# Patient Record
Sex: Female | Born: 1935 | Race: White | Hispanic: No | Smoking: Never smoker
Health system: Southern US, Community
[De-identification: ages and names within clinical notes are randomized; demographics above are authoritative.]

## PROBLEM LIST (undated history)

## (undated) DIAGNOSIS — I10 Essential (primary) hypertension: Secondary | ICD-10-CM

## (undated) DIAGNOSIS — E039 Hypothyroidism, unspecified: Secondary | ICD-10-CM

## (undated) DIAGNOSIS — F0390 Unspecified dementia without behavioral disturbance: Secondary | ICD-10-CM

---

## 2016-10-04 ENCOUNTER — Ambulatory Visit (HOSPITAL_COMMUNITY): Admit: 2016-10-04 | Payer: Self-pay | Admitting: Internal Medicine

## 2016-10-04 ENCOUNTER — Emergency Department (HOSPITAL_COMMUNITY): Payer: Medicare Other

## 2016-10-04 ENCOUNTER — Encounter (HOSPITAL_COMMUNITY)
Admission: EM | Disposition: A | Discharge status: Skilled Nursing Facility | Payer: Self-pay | Source: Home / Self Care | Attending: Cardiovascular Disease

## 2016-10-04 ENCOUNTER — Inpatient Hospital Stay (HOSPITAL_COMMUNITY)
Admission: EM | Admit: 2016-10-04 | Discharge: 2016-10-08 | DRG: 282 | Disposition: A | Discharge status: Skilled Nursing Facility | Payer: Medicare Other | Attending: Cardiovascular Disease | Admitting: Cardiovascular Disease

## 2016-10-04 DIAGNOSIS — Z66 Do not resuscitate: Secondary | ICD-10-CM | POA: Diagnosis present

## 2016-10-04 DIAGNOSIS — I272 Pulmonary hypertension, unspecified: Secondary | ICD-10-CM | POA: Diagnosis present

## 2016-10-04 DIAGNOSIS — F0390 Unspecified dementia without behavioral disturbance: Secondary | ICD-10-CM | POA: Diagnosis present

## 2016-10-04 DIAGNOSIS — Z8249 Family history of ischemic heart disease and other diseases of the circulatory system: Secondary | ICD-10-CM | POA: Diagnosis not present

## 2016-10-04 DIAGNOSIS — F0151 Vascular dementia with behavioral disturbance: Secondary | ICD-10-CM | POA: Diagnosis not present

## 2016-10-04 DIAGNOSIS — E039 Hypothyroidism, unspecified: Secondary | ICD-10-CM | POA: Diagnosis present

## 2016-10-04 DIAGNOSIS — I213 ST elevation (STEMI) myocardial infarction of unspecified site: Secondary | ICD-10-CM

## 2016-10-04 DIAGNOSIS — E785 Hyperlipidemia, unspecified: Secondary | ICD-10-CM | POA: Diagnosis present

## 2016-10-04 DIAGNOSIS — I10 Essential (primary) hypertension: Secondary | ICD-10-CM | POA: Diagnosis present

## 2016-10-04 DIAGNOSIS — I219 Acute myocardial infarction, unspecified: Secondary | ICD-10-CM | POA: Diagnosis not present

## 2016-10-04 DIAGNOSIS — E7800 Pure hypercholesterolemia, unspecified: Secondary | ICD-10-CM | POA: Diagnosis not present

## 2016-10-04 DIAGNOSIS — I214 Non-ST elevation (NSTEMI) myocardial infarction: Principal | ICD-10-CM | POA: Diagnosis present

## 2016-10-04 DIAGNOSIS — R509 Fever, unspecified: Secondary | ICD-10-CM

## 2016-10-04 DIAGNOSIS — Z79899 Other long term (current) drug therapy: Secondary | ICD-10-CM | POA: Diagnosis not present

## 2016-10-04 DIAGNOSIS — E038 Other specified hypothyroidism: Secondary | ICD-10-CM | POA: Diagnosis not present

## 2016-10-04 HISTORY — DX: Unspecified dementia, unspecified severity, without behavioral disturbance, psychotic disturbance, mood disturbance, and anxiety: F03.90

## 2016-10-04 HISTORY — DX: Essential (primary) hypertension: I10

## 2016-10-04 HISTORY — DX: Hypothyroidism, unspecified: E03.9

## 2016-10-04 LAB — DIFFERENTIAL
Basophils Absolute: 0 10*3/uL (ref 0.0–0.1)
Basophils Relative: 0 %
Eosinophils Absolute: 0.2 10*3/uL (ref 0.0–0.7)
Eosinophils Relative: 2 %
LYMPHS PCT: 30 %
Lymphs Abs: 2.6 10*3/uL (ref 0.7–4.0)
Monocytes Absolute: 0.5 10*3/uL (ref 0.1–1.0)
Monocytes Relative: 6 %
NEUTROS ABS: 5.4 10*3/uL (ref 1.7–7.7)
NEUTROS PCT: 62 %

## 2016-10-04 LAB — I-STAT TROPONIN, ED: TROPONIN I, POC: 0.83 ng/mL — AB (ref 0.00–0.08)

## 2016-10-04 LAB — COMPREHENSIVE METABOLIC PANEL
ALBUMIN: 3.5 g/dL (ref 3.5–5.0)
ALT: 11 U/L — ABNORMAL LOW (ref 14–54)
ANION GAP: 7 (ref 5–15)
AST: 24 U/L (ref 15–41)
Alkaline Phosphatase: 85 U/L (ref 38–126)
BILIRUBIN TOTAL: 0.5 mg/dL (ref 0.3–1.2)
BUN: 25 mg/dL — ABNORMAL HIGH (ref 6–20)
CO2: 24 mmol/L (ref 22–32)
Calcium: 9.5 mg/dL (ref 8.9–10.3)
Chloride: 108 mmol/L (ref 101–111)
Creatinine, Ser: 1.27 mg/dL — ABNORMAL HIGH (ref 0.44–1.00)
GFR calc non Af Amer: 36 mL/min — ABNORMAL LOW (ref >60)
GFR, EST AFRICAN AMERICAN: 41 mL/min — AB (ref >60)
GLUCOSE: 102 mg/dL — AB (ref 65–99)
POTASSIUM: 4.4 mmol/L (ref 3.5–5.1)
SODIUM: 139 mmol/L (ref 135–145)
TOTAL PROTEIN: 6.8 g/dL (ref 6.5–8.1)

## 2016-10-04 LAB — CBC
HCT: 40.1 % (ref 36.0–46.0)
Hemoglobin: 13.2 g/dL (ref 12.0–15.0)
MCH: 28.6 pg (ref 26.0–34.0)
MCHC: 32.9 g/dL (ref 30.0–36.0)
MCV: 87 fL (ref 78.0–100.0)
PLATELETS: 173 10*3/uL (ref 150–400)
RBC: 4.61 MIL/uL (ref 3.87–5.11)
RDW: 14.4 % (ref 11.5–15.5)
WBC: 8.6 10*3/uL (ref 4.0–10.5)

## 2016-10-04 LAB — LIPID PANEL
CHOL/HDL RATIO: 4.9 ratio
Cholesterol: 294 mg/dL — ABNORMAL HIGH (ref 0–200)
HDL: 60 mg/dL (ref >40)
LDL Cholesterol: 191 mg/dL — ABNORMAL HIGH (ref 0–99)
TRIGLYCERIDES: 216 mg/dL — AB (ref <150)
VLDL: 43 mg/dL — ABNORMAL HIGH (ref 0–40)

## 2016-10-04 LAB — PROTIME-INR
INR: 0.91
PROTHROMBIN TIME: 12.3 s (ref 11.4–15.2)

## 2016-10-04 LAB — APTT: aPTT: 32 seconds (ref 24–36)

## 2016-10-04 SURGERY — LEFT HEART CATH AND CORONARY ANGIOGRAPHY
Anesthesia: LOCAL

## 2016-10-04 MED ORDER — ATORVASTATIN CALCIUM 80 MG PO TABS: 80.0000 mg | ORAL_TABLET | Freq: Every day | ORAL | Status: DC

## 2016-10-04 MED ORDER — NITROGLYCERIN 0.4 MG SL SUBL: 0.4000 mg | SUBLINGUAL_TABLET | Freq: Once | SUBLINGUAL | Status: DC

## 2016-10-04 MED ORDER — HEPARIN BOLUS VIA INFUSION
4000.0000 [IU] | Freq: Once | INTRAVENOUS | Status: AC
  Administered 2016-10-04: 4000 [IU] via INTRAVENOUS
  Filled 2016-10-04: qty 4000

## 2016-10-04 MED ORDER — HEPARIN (PORCINE) IN NACL 100-0.45 UNIT/ML-% IJ SOLN
800.0000 [IU]/h | INTRAMUSCULAR | Status: DC
Start: 2016-10-04 — End: 2016-10-08
  Administered 2016-10-04 – 2016-10-06 (×2): 850 [IU]/h via INTRAVENOUS
  Filled 2016-10-04 (×4): qty 250

## 2016-10-04 MED ORDER — NITROGLYCERIN IN D5W 200-5 MCG/ML-% IV SOLN
2.0000 ug/min | INTRAVENOUS | Status: DC
  Administered 2016-10-04: 5 ug/min via INTRAVENOUS
  Administered 2016-10-06: 30 ug/min via INTRAVENOUS
  Filled 2016-10-04 (×3): qty 250

## 2016-10-04 MED ORDER — MORPHINE SULFATE (PF) 4 MG/ML IV SOLN: 2.0000 mg | INTRAVENOUS | Status: DC | PRN

## 2016-10-04 NOTE — Significant Event (Signed)
[facility] Event Note  Patient was brought to [facility] ED as CODE STEMI.  She reportedly developed chest and left arm pain this evening, though she is unable to provide any details due to dementia.  Currently, she provides mixed symptoms, including periods of chest pressure followed by lack of any chest pain/discomfort over the course of a minute.  She is resting comfortably with markedly elevated blood pressure.  Exam is notable for distant heart sounds without murmurs.  Abdomen is soft with mild left lower quadrant tenderness.  Lungs are clear bilaterally.  There is trace pretibial edema.  The patient appears pale.  EKG demonstrates NSR with 1st degree AV block and non-specific ST changes.  Subtle ST elevation (less than 1 mm is noted in I, aVL and V2).  istat Troponin is mildly elevated at 0.83.  I have spoken with the patient's daughter and healthcare POA, Maximino Sarin.  She states that her mother's dementia is quite advanced but that she would want invasive procedures if necessary.  The patient has a DNR, but per her daughter, this would be reversed for cardiac catheterization.  We will not proceed with emergent LHC given the patient's vague symptoms and non-specific EKG changes that do not meed STEMI criteria.  We will admit her to the cardiology service with medical therapy for NSTEMI.  Yvonne Kendall, MD [facility] Pager: [phone]

## 2016-10-04 NOTE — ED Provider Notes (Signed)
LevelVcaveatdementia urgency of situation history is obtained from EMS. He complained of chest pain earlier this evening. She was treated by EMS with 2 sublingual nitroglycerin and 4 baby aspirin's. She is asymptomatic as I examine her. Denies any chest pain. EMS called code STEMI in the field. On exam no distress lungs clear auscultation heart regular rate and rhythm abdomen nondistended nontender extremities with trace pretibial edema. EKG possibly consistent with acute lateral wall STEMI with no old EKG for comparison however patient is pain-free presently. Dr. END, from cardiology service was present in the ED and will admit patient. He requests IV heparin. Ordered by me.   Doug Sou, MD 10/04/16 2211

## 2016-10-04 NOTE — Progress Notes (Signed)
ANTICOAGULATION CONSULT NOTE - Initial Consult  Pharmacy Consult for Heparin Indication: chest pain/ACS  No Known Allergies  Patient Measurements: Height: 5\' 3"  (160 cm) Weight: 167 lb 8.8 oz (76 kg) IBW/kg (Calculated) : 52.4 Heparin Dosing Weight: 68 kg  Vital Signs: Temp: 99.3 F (37.4 C) (12/30 2148) Temp Source: Oral (12/30 2148) BP: 164/112 (12/30 2148) Pulse Rate: 90 (12/30 2148)  Labs:  Recent Labs  10/04/16 2153  HGB 13.2  HCT 40.1  PLT 173  APTT 32  LABPROT 12.3  INR 0.91    CrCl cannot be calculated (No order found.).   Medical History: No past medical history on file.  Medications:  See electronic med rec  Assessment: 82 y.o. F presents with NSTEMI. To begin heparin. CBC ok on admission.  Goal of Therapy:  Heparin level 0.3-0.7 units/ml Monitor platelets by anticoagulation protocol: Yes   Plan:  Heparin IV bolus 4000 units Heparin gtt at 850 units/hr Will f/u heparin level in 8 hours Daily heparin level and CBC  Christoper Fabian, PharmD, BCPS Clinical pharmacist, pager [phone] 10/04/2016,10:41 PM

## 2016-10-04 NOTE — ED Triage Notes (Signed)
Pt from [place] c/o of chest pain this afternoon. Sharp pressure that radiates to the left. Given 324 ASA and 2x nitro

## 2016-10-04 NOTE — H&P (Signed)
    Patient ID: Bonnie [name] MRN: [id number], DOB/AGE: 80/17/1923   Admit date: 10/04/2016   Primary Physician: No primary care provider on file. Primary Cardiologist: None  Problem List Chest pain Hypertension Dementia Hypothyroidism  Allergies NKDA  HPI 82 yo female with history of hypertension, hypothyroidism and dementia who presents from her nursing home with chest and left arm pain.  This evening, 12/30 she reported chest pressure and left arm pain to her nurses and thus EMS was called. Speaking with her nurse from the nursing home, the nurse said the patient was in her normal state of health when walked up and told her about the chest pain which had started around 7-8pm.  The patient was unable to provide consistent details about her chest pain, at time present as pressure while moments later without any complaints or pain. In ED, she was comfortable with a BP of 202/84.   Initially EMS activated for STEMI, but EKG with only subtle (<17mm) elevations in I, aVL, V2). No indication for urgent cath at this time.   Home Medications Coreg 3.125mg  BID Sertraline Levothyroxine Albuterol  Family History: history of CAD in family  Social History Lives in NH, no reports of smoking use in past  Review of Systems Unable to assess reliably given dementia but denies most ROS  Physical Exam  Blood pressure (!) 164/112, pulse 90, temperature 99.3 F (37.4 C), temperature source Oral, resp. rate 18, SpO2 96 %.  General: Pleasant, NAD Neuro: Moves all extremities spontaneously. HEENT: Normal  Neck: Supple without bruits or JVD. Lungs:  Resp regular and unlabored, CTA. Heart: RRR, distant heart sounds Abdomen: Soft, non-tender, non-distended, BS + x 4.  Extremities: No clubbing, cyanosis. Trace edema. DP/PT/Radials 2+ and equal bilaterally.  Labs  Troponin [facility] Test)  Recent Labs  10/04/16 2156  TROPIPOC 0.83*    Cr 1.2  ECG NSR with 1st degree AV  block and non-specific ST changes.  Subtle ST elevation (less than 1 mm is noted in I, aVL and V2).  ================================ ASSESSMENT AND PLAN 82 yo female with history of hypertension, hypothyroidism and dementia who presents from her nursing home with chest and left arm pain, found to have an NSTEMI.  # NSTEMI: trop 0.83. Intermittent chest pressure.  - Heparin gtt - ASA 325x1, ASA 81mg  daily - Metop BID - Atorvastatin - Nitro gtt, titrated to chest pain - Morphine PRN - repeat troponinx3 and ekg - TTE to assess EF/WMA - if chest pressure/pain unrelenting with Nitroglycerin, will d/w patient's family about LHC with possible PCI  # HTN - medical management as above  # Hypothyroidism - f/u TSH  # HLD - statin   # Dementia  Code status: DNR (family ok to reverse this for cath if needed)  Signed, Yehuda Savannah, MD

## 2016-10-04 NOTE — ED Provider Notes (Signed)
[facility]-EMERGENCY DEPT Provider Note   CSN: [id number] Arrival date & time: 10/04/16  2144   History   Chief Complaint Chief Complaint  Patient presents with  . Chest Pain    HPI Bonnie [name] is a 82 y.o. female.  The history is provided by the patient and the EMS personnel.  Chest Pain   This is a new problem. The current episode started 3 to 5 hours ago (unclear onset but maybe around dinner time). The problem has been gradually improving. The pain is present in the substernal region. The pain is at a severity of 3/10. The pain is mild. The quality of the pain is described as pressure-like. The pain does not radiate. Duration of episode(s) is 3 hours. Pertinent negatives include no abdominal pain, no fever, no leg pain, no lower extremity edema, no nausea, no shortness of breath and no syncope. She has tried nitroglycerin (Pt receive ASA and 2 NTG SL PTA, CP improved after 2nd NTG) for the symptoms.  -Patient is demented and came in from memory care unit. Patient informed the staff that she was having chest pain around dinner time. Patient describes the pain as "feels like someone is standing on my chest". On arrival patient with chest pain 3/10 but became more severe well in the Ed. Patient presented as a code STEMI.   Past Medical History:  Diagnosis Date  . Dementia   . Hypertension   . Hypothyroid     Patient Active Problem List   Diagnosis Date Noted  . NSTEMI (non-ST elevated myocardial infarction) (HCC) 10/04/2016    No past surgical history on file.  OB History    No data available       Home Medications    Prior to Admission medications   Medication Sig Start Date End Date Taking? Authorizing Provider  acetaminophen (TYLENOL) 500 MG tablet Take 500 mg by mouth 2 (two) times daily.   Yes Historical Provider, MD  albuterol (PROVENTIL HFA;VENTOLIN HFA) 108 (90 Base) MCG/ACT inhaler Inhale 2 puffs into the lungs every 6 (six) hours as needed for wheezing or  shortness of breath.   Yes Historical Provider, MD  carvedilol (COREG) 6.25 MG tablet Take 3.125 mg by mouth daily.   Yes Historical Provider, MD  levothyroxine (SYNTHROID, LEVOTHROID) 25 MCG tablet Take 25 mcg by mouth See admin instructions. On Monday, Tuesday, Wednesday and Thursday   Yes Historical Provider, MD  levothyroxine (SYNTHROID, LEVOTHROID) 50 MCG tablet Take 50 mcg by mouth See admin instructions. On Friday, Saturday and Sunday   Yes Historical Provider, MD  loperamide (IMODIUM A-D) 2 MG tablet Take 2 mg by mouth every 8 (eight) hours as needed for diarrhea or loose stools.   Yes Historical Provider, MD  ranitidine (ZANTAC) 150 MG tablet Take 150 mg by mouth daily as needed for heartburn.   Yes Historical Provider, MD  sertraline (ZOLOFT) 50 MG tablet Take 50 mg by mouth daily.   Yes Historical Provider, MD    Family History No family history on file.  Social History Social History  Substance Use Topics  . Smoking status: Not on file  . Smokeless tobacco: Not on file  . Alcohol use Not on file     Allergies   Patient has no known allergies.   Review of Systems Review of Systems  Unable to perform ROS: Dementia  Constitutional: Negative for fever.  Respiratory: Negative for shortness of breath.   Cardiovascular: Positive for chest pain. Negative for leg swelling and  syncope.  Gastrointestinal: Negative for abdominal pain and nausea.  Neurological: Negative for syncope and light-headedness.  All other systems reviewed and are negative.    Physical Exam Updated Vital Signs BP 175/72   Pulse 75   Temp 99.3 F (37.4 C) (Oral)   Resp 17   Ht 5\' 3"  (1.6 m)   Wt 76 kg   SpO2 96%   BMI 29.68 kg/m   Physical Exam  Constitutional: She appears well-developed and well-nourished. No distress.  HENT:  Head: Normocephalic and atraumatic.  Eyes: Conjunctivae are normal.  Neck: Neck supple.  Cardiovascular: Normal rate, regular rhythm and intact distal pulses.     No murmur heard. Pulmonary/Chest: Effort normal and breath sounds normal. No respiratory distress. She has no wheezes. She has no rales.  Abdominal: Soft. There is no tenderness.  Musculoskeletal: She exhibits no edema (trace pitting edema).  Neurological: She is alert.  Skin: Skin is warm and dry.  Psychiatric: She has a normal mood and affect.  Nursing note and vitals reviewed.    ED Treatments / Results  Labs (all labs ordered are listed, but only abnormal results are displayed) Labs Reviewed  COMPREHENSIVE METABOLIC PANEL - Abnormal; Notable for the following:       Result Value   Glucose, Bld 102 (*)    BUN 25 (*)    Creatinine, Ser 1.27 (*)    ALT 11 (*)    GFR calc non Af Amer 36 (*)    GFR calc Af Amer 41 (*)    All other components within normal limits  LIPID PANEL - Abnormal; Notable for the following:    Cholesterol 294 (*)    Triglycerides 216 (*)    VLDL 43 (*)    LDL Cholesterol 191 (*)    All other components within normal limits  I-STAT TROPOININ, ED - Abnormal; Notable for the following:    Troponin i, poc 0.83 (*)    All other components within normal limits  CBC  DIFFERENTIAL  PROTIME-INR  APTT  HEPARIN LEVEL (UNFRACTIONATED)  I-STAT TROPOININ, ED    EKG  EKG Interpretation  Date/Time:  Saturday October 04 2016 21:48:29 EST Ventricular Rate:  89 PR Interval:    QRS Duration: 102 QT Interval:  379 QTC Calculation: 462 R Axis:   -26 Text Interpretation:  Sinus rhythm Prolonged PR interval Borderline left axis deviation Low voltage, precordial leads Abnormal lateral Q waves No old tracing to compare Confirmed by Ethelda Chick  MD, SAM [phone]) on 10/04/2016 9:54:43 PM       Radiology Dg Chest Port 1 View  Result Date: 10/04/2016 CLINICAL DATA:  Chest pain this afternoon. Chest pressure radiates the left. EXAM: PORTABLE CHEST 1 VIEW COMPARISON:  None. FINDINGS: Heart is borderline enlarged. There is aortic atherosclerosis without aneurysm.  Mild diffuse interstitial prominence is noted with peribronchial thickening which may reflect bronchitic change. There is atelectasis at each base. Lung volumes are slightly low. No suspicious osseous abnormality. IMPRESSION: Mild interstitial prominence with peribronchial thickening may reflect prior bronchitic change. Aortic atherosclerosis with borderline cardiomegaly. Electronically Signed   By: Tollie Eth M.D.   On: 10/04/2016 22:22    Procedures Procedures (including critical care time)  Medications Ordered in ED Medications  nitroGLYCERIN (NITROSTAT) SL tablet 0.4 mg (not administered)  morphine 4 MG/ML injection 2 mg (not administered)  atorvastatin (LIPITOR) tablet 80 mg (not administered)  nitroGLYCERIN 50 mg in dextrose 5 % 250 mL (0.2 mg/mL) infusion (5 mcg/min Intravenous New Bag/Given  10/04/16 2252)  heparin ADULT infusion 100 units/mL (25000 units/22350mL sodium chloride 0.45%) (850 Units/hr Intravenous New Bag/Given 10/04/16 2305)  heparin bolus via infusion 4,000 Units (4,000 Units Intravenous Bolus from Bag 10/04/16 2305)     Initial Impression / Assessment and Plan / ED Course  I have reviewed the triage vital signs and the nursing notes.  Pertinent labs & imaging results that were available during my care of the patient were reviewed by me and considered in my medical decision making (see chart for details).  Clinical Course    Patient is a 82 year old female with history hypertension, hypothyroidism, dementia who presents with 3 hours of substernal chest pain. Patient received aspirin and 2 doses of nitroglycerin prior to arrival. Second is about to glycerin reduced the patient's pain.  Unclear history given the patient's dementia however chest pain began likely around dinnertime. Patient informed the staff that she was having chest discomfort. Pain initially 3/10 on arrival to the ED but did become more severe around 10 minutes into her ED stay. Patient presented as a  code STEMI. Patient has small ST elevations in lead 1, aVL, V2. ST depression in lead 3, aVF. Cardiology evaluated the patient in the ED and deemed this to not be a STEMI but rather NSTEMI. First troponin was 0.83. Patient given heparin bolus and heparin drip. Additional dose of nitroglycerin relieved the patient's pain.  Patient will be admitted to cardiology for further management. Cardiology does not plan to take the patient to the Cath Lab at this time.  Pt seen with attending Dr. Ethelda ChickJacubowitz.  Final Clinical Impressions(s) / ED Diagnoses   Final diagnoses:  ST elevation myocardial infarction (STEMI), unspecified artery [facility](HCC)    New Prescriptions New Prescriptions   No medications on file     Dwana MelenaRobin Alizza Sacra, DO 10/05/16 0101    Doug SouSam Jacubowitz, MD 10/05/16 [phone]

## 2016-10-05 ENCOUNTER — Encounter (HOSPITAL_COMMUNITY): Payer: Self-pay | Admitting: Emergency Medicine

## 2016-10-05 DIAGNOSIS — I10 Essential (primary) hypertension: Secondary | ICD-10-CM

## 2016-10-05 DIAGNOSIS — I214 Non-ST elevation (NSTEMI) myocardial infarction: Principal | ICD-10-CM

## 2016-10-05 DIAGNOSIS — E038 Other specified hypothyroidism: Secondary | ICD-10-CM

## 2016-10-05 DIAGNOSIS — F0390 Unspecified dementia without behavioral disturbance: Secondary | ICD-10-CM

## 2016-10-05 DIAGNOSIS — E7800 Pure hypercholesterolemia, unspecified: Secondary | ICD-10-CM

## 2016-10-05 LAB — PROTIME-INR
INR: 1.03
PROTHROMBIN TIME: 13.6 s (ref 11.4–15.2)

## 2016-10-05 LAB — GLUCOSE, CAPILLARY
GLUCOSE-CAPILLARY: 109 mg/dL — AB (ref 65–99)
GLUCOSE-CAPILLARY: 122 mg/dL — AB (ref 65–99)
Glucose-Capillary: 104 mg/dL — ABNORMAL HIGH (ref 65–99)

## 2016-10-05 LAB — TSH: TSH: 2.998 u[IU]/mL (ref 0.350–4.500)

## 2016-10-05 LAB — BASIC METABOLIC PANEL
ANION GAP: 8 (ref 5–15)
BUN: 20 mg/dL (ref 6–20)
CALCIUM: 9.2 mg/dL (ref 8.9–10.3)
CO2: 26 mmol/L (ref 22–32)
CREATININE: 1.1 mg/dL — AB (ref 0.44–1.00)
Chloride: 106 mmol/L (ref 101–111)
GFR, EST AFRICAN AMERICAN: 49 mL/min — AB (ref >60)
GFR, EST NON AFRICAN AMERICAN: 42 mL/min — AB (ref >60)
Glucose, Bld: 98 mg/dL (ref 65–99)
Potassium: 4.4 mmol/L (ref 3.5–5.1)
SODIUM: 140 mmol/L (ref 135–145)

## 2016-10-05 LAB — CBC
HCT: 36.8 % (ref 36.0–46.0)
HEMOGLOBIN: 11.9 g/dL — AB (ref 12.0–15.0)
MCH: 28.7 pg (ref 26.0–34.0)
MCHC: 32.3 g/dL (ref 30.0–36.0)
MCV: 88.9 fL (ref 78.0–100.0)
Platelets: 145 10*3/uL — ABNORMAL LOW (ref 150–400)
RBC: 4.14 MIL/uL (ref 3.87–5.11)
RDW: 14.6 % (ref 11.5–15.5)
WBC: 7.3 10*3/uL (ref 4.0–10.5)

## 2016-10-05 LAB — LIPID PANEL
CHOLESTEROL: 256 mg/dL — AB (ref 0–200)
HDL: 58 mg/dL (ref >40)
LDL Cholesterol: 175 mg/dL — ABNORMAL HIGH (ref 0–99)
Total CHOL/HDL Ratio: 4.4 RATIO
Triglycerides: 115 mg/dL (ref <150)
VLDL: 23 mg/dL (ref 0–40)

## 2016-10-05 LAB — HEPARIN LEVEL (UNFRACTIONATED): HEPARIN UNFRACTIONATED: 0.55 [IU]/mL (ref 0.30–0.70)

## 2016-10-05 LAB — I-STAT TROPONIN, ED: TROPONIN I, POC: 2.1 ng/mL — AB (ref 0.00–0.08)

## 2016-10-05 LAB — MRSA PCR SCREENING: MRSA by PCR: NEGATIVE

## 2016-10-05 LAB — TROPONIN I
TROPONIN I: 4.81 ng/mL — AB (ref <0.03)
TROPONIN I: 6.26 ng/mL — AB (ref <0.03)
Troponin I: 2.69 ng/mL (ref <0.03)

## 2016-10-05 MED ORDER — ONDANSETRON HCL 4 MG/2ML IJ SOLN
4.0000 mg | Freq: Four times a day (QID) | INTRAMUSCULAR | Status: DC | PRN
  Administered 2016-10-06: 4 mg via INTRAVENOUS
  Filled 2016-10-05: qty 2

## 2016-10-05 MED ORDER — LEVOTHYROXINE SODIUM 75 MCG PO TABS
75.0000 ug | ORAL_TABLET | Freq: Every day | ORAL | Status: DC
  Administered 2016-10-05 – 2016-10-08 (×3): 75 ug via ORAL
  Filled 2016-10-05 (×3): qty 1

## 2016-10-05 MED ORDER — NITROGLYCERIN 0.4 MG SL SUBL: 0.4000 mg | SUBLINGUAL_TABLET | SUBLINGUAL | Status: DC | PRN

## 2016-10-05 MED ORDER — ATORVASTATIN CALCIUM 80 MG PO TABS
80.0000 mg | ORAL_TABLET | Freq: Every day | ORAL | Status: DC
  Administered 2016-10-07: 80 mg via ORAL
  Filled 2016-10-05 (×3): qty 1

## 2016-10-05 MED ORDER — LORAZEPAM 0.5 MG PO TABS: 0.5000 mg | ORAL_TABLET | ORAL | Status: DC

## 2016-10-05 MED ORDER — ASPIRIN EC 81 MG PO TBEC
81.0000 mg | DELAYED_RELEASE_TABLET | Freq: Every day | ORAL | Status: DC
  Administered 2016-10-05 – 2016-10-08 (×4): 81 mg via ORAL
  Filled 2016-10-05 (×4): qty 1

## 2016-10-05 MED ORDER — LORAZEPAM 2 MG/ML IJ SOLN
0.5000 mg | Freq: Once | INTRAMUSCULAR | Status: AC
  Administered 2016-10-05: 0.5 mg via INTRAVENOUS
  Filled 2016-10-05: qty 1

## 2016-10-05 MED ORDER — METOPROLOL TARTRATE 12.5 MG HALF TABLET
12.5000 mg | ORAL_TABLET | Freq: Two times a day (BID) | ORAL | Status: DC
  Administered 2016-10-05 (×3): 12.5 mg via ORAL
  Filled 2016-10-05 (×3): qty 1

## 2016-10-05 MED ORDER — ACETAMINOPHEN 325 MG PO TABS
650.0000 mg | ORAL_TABLET | ORAL | Status: DC | PRN
  Administered 2016-10-07: 650 mg via ORAL
  Filled 2016-10-05: qty 2

## 2016-10-05 NOTE — ED Notes (Signed)
Attempted to call report

## 2016-10-05 NOTE — Progress Notes (Signed)
Pts daughter not wanting pt to take lipitor, states pt has been c/o right flank pain which is similar to the pain she experienced when she took lipitor, evening dose held per request, MD notified.  Raymon Mutton RN

## 2016-10-05 NOTE — Progress Notes (Signed)
Patient Name: Bonnie [name] Date of Encounter: 10/05/2016  Primary Cardiologist: None  Hospital Problem List     Principal Problem:   NSTEMI (non-ST elevated myocardial infarction) (HCC)     Subjective   Follows commands. Alert. Not oriented. At times says she has chest pain, at others she does not.  Inpatient Medications    Scheduled Meds: . aspirin EC  81 mg Oral Daily  . atorvastatin  80 mg Oral q1800  . metoprolol tartrate  12.5 mg Oral BID   Continuous Infusions: . heparin 850 Units/hr (10/04/16 2305)  . nitroGLYCERIN 30 mcg/min (10/05/16 0202)   PRN Meds: acetaminophen, nitroGLYCERIN, ondansetron (ZOFRAN) IV   Vital Signs    Vitals:   10/05/16 0148 10/05/16 0202 10/05/16 0246 10/05/16 0400  BP: (!) 219/75 (!) 213/81 (!) 168/77 (!) 154/78  Pulse:   66 69  Resp:   16   Temp:   99.2 F (37.3 C)   TempSrc:   Oral   SpO2:   95% 93%  Weight:   158 lb (71.7 kg)   Height:        Intake/Output Summary (Last 24 hours) at 10/05/16 0827 Last data filed at 10/05/16 0700  Gross per 24 hour  Intake           102.36 ml  Output              375 ml  Net          -272.64 ml   Filed Weights   10/04/16 2233 10/05/16 0246  Weight: 167 lb 8.8 oz (76 kg) 158 lb (71.7 kg)    Physical Exam    GEN: Well nourished, well developed, in no acute distress.  HEENT: Grossly normal.  Neck: Supple, no JVD. Cardiac: RRR, no murmurs, rubs, or gallops. No clubbing, cyanosis, edema.  Respiratory:  Respirations regular and unlabored, clear to auscultation bilaterally. GI: Soft, nontender, nondistended MS: no deformity or atrophy. Skin: warm and dry, no rash. Psych: Alert, follows commands  Labs    CBC  Recent Labs  10/04/16 2153 10/05/16 0649  WBC 8.6 7.3  NEUTROABS 5.4  --   HGB 13.2 11.9*  HCT 40.1 36.8  MCV 87.0 88.9  PLT 173 145*   Basic Metabolic Panel  Recent Labs  10/04/16 2153 10/05/16 0649  NA 139 140  K 4.4 4.4  CL 108 106  CO2 24 26  GLUCOSE  102* 98  BUN 25* 20  CREATININE 1.27* 1.10*  CALCIUM 9.5 9.2   Liver Function Tests  Recent Labs  10/04/16 2153  AST 24  ALT 11*  ALKPHOS 85  BILITOT 0.5  PROT 6.8  ALBUMIN 3.5   No results for input(s): LIPASE, AMYLASE in the last 72 hours. Cardiac Enzymes  Recent Labs  10/05/16 0108 10/05/16 0649  TROPONINI 2.69* 4.81*   BNP Invalid input(s): POCBNP D-Dimer No results for input(s): DDIMER in the last 72 hours. Hemoglobin A1C No results for input(s): HGBA1C in the last 72 hours. Fasting Lipid Panel  Recent Labs  10/05/16 0530  CHOL 256*  HDL 58  LDLCALC 175*  TRIG 115  CHOLHDL 4.4   Thyroid Function Tests  Recent Labs  10/05/16 0530  TSH 2.998    Telemetry    NSR, short atrial run, isolated PVC's - Personally Reviewed  ECG     - Personally Reviewed  Radiology    Dg Chest Port 1 View  Result Date: 10/04/2016 CLINICAL DATA:  Chest pain this afternoon.  Chest pressure radiates the left. EXAM: PORTABLE CHEST 1 VIEW COMPARISON:  None. FINDINGS: Heart is borderline enlarged. There is aortic atherosclerosis without aneurysm. Mild diffuse interstitial prominence is noted with peribronchial thickening which may reflect bronchitic change. There is atelectasis at each base. Lung volumes are slightly low. No suspicious osseous abnormality. IMPRESSION: Mild interstitial prominence with peribronchial thickening may reflect prior bronchitic change. Aortic atherosclerosis with borderline cardiomegaly. Electronically Signed   By: Tollie Ethavid  Kwon M.D.   On: 10/04/2016 22:22    Cardiac Studies   Echo (ordering now)  Patient Profile     82 yr old woman with hypertension and dementia admitted with NSTEMI from nursing home. DNR but family agreeable to reverse for possible cath.  Assessment & Plan    1. NSTEMI: Hemodynamically and symptomatically stable. On IV heparin and nitro (30 mcg/min). Continue ASA, Lipitor, and metoprolol. I will order a 2-D echocardiogram  with Doppler to evaluate cardiac structure, function, and regional wall motion. Troponins 2.69-->4.81. I will tentatively arrange coronary angiography for 10/07/16. Will order cardiac diet for today.  2. Hypertension: Elevated, recently started on metoprolol. No adjustments at this time.  3. Hyperlipidemia: On Lipitor.  4. Dementia  5. Hypothyroidism: Will restart levothyroxine. TSH 2.99.  Code status: DNR (family ok to reverse this for cath if needed)    Signed, Prentice DockerSuresh Kylon Philbrook, MD  10/05/2016, 8:27 AM

## 2016-10-05 NOTE — Plan of Care (Signed)
Problem: Education: Goal: Knowledge of [facility] Education information/materials will improve Outcome: Not Progressing Pt confused  Problem: Safety: Goal: Ability to remain free from injury will improve Outcome: Progressing Pt is confused, placed in camera room, family has been requested to stay with pt, bed in low position, frequent assessments being made.  Problem: Pain Managment: Goal: General experience of comfort will improve Outcome: Progressing No complaints of discomfort from pt  Problem: Physical Regulation: Goal: Ability to maintain clinical measurements within normal limits will improve Outcome: Progressing BP WNL at present, labs for cardiac enzymes ongoing

## 2016-10-05 NOTE — Progress Notes (Signed)
ANTICOAGULATION CONSULT NOTE   Pharmacy Consult for Heparin Indication: chest pain/ACS  No Known Allergies  Patient Measurements: Height: 5\' 3"  (160 cm) Weight: 158 lb (71.7 kg) IBW/kg (Calculated) : 52.4 Heparin Dosing Weight: 68 kg  Vital Signs: Temp: 99.2 F (37.3 C) (12/31 0246) Temp Source: Oral (12/31 0246) BP: 154/78 (12/31 0400) Pulse Rate: 69 (12/31 0400)  Labs:  Recent Labs  10/04/16 2153 10/05/16 0108 10/05/16 0649  HGB 13.2  --  11.9*  HCT 40.1  --  36.8  PLT 173  --  145*  APTT 32  --   --   LABPROT 12.3  --  13.6  INR 0.91  --  1.03  HEPARINUNFRC  --   --  0.55  CREATININE 1.27*  --  1.10*  TROPONINI  --  2.69* 4.81*    Estimated Creatinine Clearance: 31 mL/min (by C-G formula based on SCr of 1.1 mg/dL (H)).   Medical History: Past Medical History:  Diagnosis Date  . Dementia   . Hypertension   . Hypothyroid     Medications:  See electronic med rec  Assessment: 82 y.o. F presents with NSTEMI, pharmacy to dose heparin.   Confirmatory heparin level was therapeutic at 0.55 units/mL. Platelet count and hemoglobin are slightly low at 145 k/uL and 11.9g/dL. No bleeding noted.   Goal of Therapy:  Heparin level 0.3-0.7 units/ml Monitor platelets by anticoagulation protocol: Yes   Plan:  Continue heparin infusion at 850 units/hr IV Daily heparin level and CBC Monitor for signs/symptoms of bleeding  Carylon Perches, PharmD Acute Care Pharmacy Resident  Pager: [phone] 10/05/2016

## 2016-10-06 ENCOUNTER — Inpatient Hospital Stay (HOSPITAL_COMMUNITY): Payer: Medicare Other

## 2016-10-06 DIAGNOSIS — I219 Acute myocardial infarction, unspecified: Secondary | ICD-10-CM

## 2016-10-06 LAB — CBC
HCT: 35.8 % — ABNORMAL LOW (ref 36.0–46.0)
Hemoglobin: 11.8 g/dL — ABNORMAL LOW (ref 12.0–15.0)
MCH: 28.4 pg (ref 26.0–34.0)
MCHC: 33 g/dL (ref 30.0–36.0)
MCV: 86.1 fL (ref 78.0–100.0)
PLATELETS: 168 10*3/uL (ref 150–400)
RBC: 4.16 MIL/uL (ref 3.87–5.11)
RDW: 14.2 % (ref 11.5–15.5)
WBC: 6.9 10*3/uL (ref 4.0–10.5)

## 2016-10-06 LAB — GLUCOSE, CAPILLARY
GLUCOSE-CAPILLARY: 115 mg/dL — AB (ref 65–99)
GLUCOSE-CAPILLARY: 114 mg/dL — AB (ref 65–99)
Glucose-Capillary: 96 mg/dL (ref 65–99)
Glucose-Capillary: 101 mg/dL — ABNORMAL HIGH (ref 65–99)

## 2016-10-06 LAB — HEPARIN LEVEL (UNFRACTIONATED): HEPARIN UNFRACTIONATED: 0.54 [IU]/mL (ref 0.30–0.70)

## 2016-10-06 LAB — ECHOCARDIOGRAM COMPLETE
HEIGHTINCHES: 63 in
Weight: 2528 oz

## 2016-10-06 MED ORDER — SODIUM CHLORIDE 0.9 % IV SOLN: 250.0000 mL | INTRAVENOUS | Status: DC | PRN

## 2016-10-06 MED ORDER — SODIUM CHLORIDE 0.9 % WEIGHT BASED INFUSION: 1.0000 mL/kg/h | INTRAVENOUS | Status: DC

## 2016-10-06 MED ORDER — SODIUM CHLORIDE 0.9% FLUSH: 3.0000 mL | INTRAVENOUS | Status: DC | PRN

## 2016-10-06 MED ORDER — SODIUM CHLORIDE 0.9 % WEIGHT BASED INFUSION: 3.0000 mL/kg/h | INTRAVENOUS | Status: DC

## 2016-10-06 MED ORDER — METOPROLOL TARTRATE 25 MG PO TABS
25.0000 mg | ORAL_TABLET | Freq: Two times a day (BID) | ORAL | Status: DC
  Administered 2016-10-06 – 2016-10-08 (×5): 25 mg via ORAL
  Filled 2016-10-06 (×5): qty 1

## 2016-10-06 MED ORDER — SODIUM CHLORIDE 0.9% FLUSH
3.0000 mL | Freq: Two times a day (BID) | INTRAVENOUS | Status: DC
  Administered 2016-10-06 – 2016-10-07 (×2): 3 mL via INTRAVENOUS

## 2016-10-06 MED ORDER — SODIUM CHLORIDE 0.9 % WEIGHT BASED INFUSION
1.0000 mL/kg/h | INTRAVENOUS | Status: DC
  Administered 2016-10-07: 1 mL/kg/h via INTRAVENOUS

## 2016-10-06 NOTE — Clinical Social Work Note (Signed)
Clinical Social Work Assessment  Patient Details  Name: Bonnie [name] MRN: [id number] Date of Birth: 05-17-1922  Date of referral:  10/06/16               Reason for consult:  Discharge Planning, Facility Placement                Permission sought to share information with:  Facility Medical sales representative, Family Supports Permission granted to share information::  Yes, Verbal Permission Granted  Name::     Tim  Agency::  [facility]  Relationship::     Contact Information:     Housing/Transportation Living arrangements for the past 2 months:  Assisted Dealer of Information:  Adult Children Patient Interpreter Needed:  None Criminal Activity/Legal Involvement Pertinent to Current Situation/Hospitalization:  No - Comment as needed Significant Relationships:  Adult Children Lives with:  Facility Resident Do you feel safe going back to the place where you live?  Yes Need for family participation in patient care:  Yes (Comment)  Care giving concerns:  The patient's family does not report any concerns at this time. They are happy with the care the patient has received at [facility].   Social Worker assessment / plan:  CSW spoke with patient's son Bonnie [name] by phone to complete assessment. Bonnie [name] confirms that the patient is from [facility] and plans for the patient to return to the facility at time of discharge. CSW explained CSW role and process of getting the patient back to the facility at time of discharge. CSW will assist with DC once medically appropriate.  Employment status:  Disabled (Comment on whether or not currently receiving Disability), Retired Database administrator PT Recommendations:  Not assessed at this time Information / Referral to community resources:  Other (Comment Required) (Information will be sent to [facility])  Patient/Family's Response to care:  The patient's son is happy with the care the patient has received. He  appreciates CSW's assistance with discharge planning.  Patient/Family's Understanding of and Emotional Response to Diagnosis, Current Treatment, and Prognosis:  The patient's son Bonnie [name] appears to have a good understanding of the reason for the patient's admission and what her post DC needs will be.   Emotional Assessment Appearance:  Appears stated age Attitude/Demeanor/Rapport:  Unable to Assess Affect (typically observed):  Unable to Assess Orientation:    Alcohol / Substance use:  Not Applicable Psych involvement (Current and /or in the community):  No (Comment)  Discharge Needs  Concerns to be addressed:  Care Coordination, Discharge Planning Concerns Readmission within the last 30 days:  No Current discharge risk:  Cognitively Impaired Barriers to Discharge:  Continued Medical Work up   Venita Lick, LCSW 10/06/2016, 3:40 PM

## 2016-10-06 NOTE — Progress Notes (Signed)
Patient Name: Bonnie [name] Date of Encounter: 10/06/2016  Primary Cardiologist: None  Hospital Problem List     Principal Problem:   NSTEMI (non-ST elevated myocardial infarction) (HCC)     Subjective   Complains of feeling sick to her stomach. Denies chest pain/SOB.  Inpatient Medications    Scheduled Meds: . aspirin EC  81 mg Oral Daily  . atorvastatin  80 mg Oral q1800  . levothyroxine  75 mcg Oral QAC breakfast  . metoprolol tartrate  25 mg Oral BID   Continuous Infusions: . heparin 850 Units/hr (10/06/16 0038)  . nitroGLYCERIN 30 mcg/min (10/06/16 0037)   PRN Meds: acetaminophen, nitroGLYCERIN, ondansetron (ZOFRAN) IV   Vital Signs    Vitals:   10/05/16 0400 10/05/16 1505 10/05/16 1506 10/05/16 2100  BP: (!) 154/78 (!) 144/76 (!) 144/76 (!) 151/76  Pulse: 69 (!) 151 69 76  Resp:  20 (!) 21 (!) 21  Temp:   99.5 F (37.5 C) 99.9 F (37.7 C)  TempSrc:   Oral Oral  SpO2: 93% 92% 95% 96%  Weight:      Height:        Intake/Output Summary (Last 24 hours) at 10/06/16 0825 Last data filed at 10/06/16 1610  Gross per 24 hour  Intake           342.83 ml  Output             1301 ml  Net          -958.17 ml   Filed Weights   10/04/16 2233 10/05/16 0246  Weight: 167 lb 8.8 oz (76 kg) 158 lb (71.7 kg)    Physical Exam    GEN: Well nourished, well developed, in no acute distress.  HEENT: Grossly normal.  Neck: Supple, no JVD. Cardiac: RRR, no murmurs, rubs, or gallops. No clubbing, cyanosis, edema.  Respiratory:  Respirations regular and unlabored, clear to auscultation bilaterally. GI: Soft, nontender, nondistended MS: no deformity or atrophy. Skin: warm and dry, no rash. Psych: Alert, follows commands  Labs    CBC  Recent Labs  10/04/16 2153 10/05/16 0649 10/06/16 0153  WBC 8.6 7.3 6.9  NEUTROABS 5.4  --   --   HGB 13.2 11.9* 11.8*  HCT 40.1 36.8 35.8*  MCV 87.0 88.9 86.1  PLT 173 145* 168   Basic Metabolic Panel  Recent Labs  10/04/16 2153 10/05/16 0649  NA 139 140  K 4.4 4.4  CL 108 106  CO2 24 26  GLUCOSE 102* 98  BUN 25* 20  CREATININE 1.27* 1.10*  CALCIUM 9.5 9.2   Liver Function Tests  Recent Labs  10/04/16 2153  AST 24  ALT 11*  ALKPHOS 85  BILITOT 0.5  PROT 6.8  ALBUMIN 3.5   No results for input(s): LIPASE, AMYLASE in the last 72 hours. Cardiac Enzymes  Recent Labs  10/05/16 0108 10/05/16 0649 10/05/16 1326  TROPONINI 2.69* 4.81* 6.26*   BNP Invalid input(s): POCBNP D-Dimer No results for input(s): DDIMER in the last 72 hours. Hemoglobin A1C No results for input(s): HGBA1C in the last 72 hours. Fasting Lipid Panel  Recent Labs  10/05/16 0530  CHOL 256*  HDL 58  LDLCALC 175*  TRIG 115  CHOLHDL 4.4   Thyroid Function Tests  Recent Labs  10/05/16 0530  TSH 2.998    Telemetry    NSR - Personally Reviewed  ECG    - Personally Reviewed  Radiology    Dg Chest [facility]  Result Date: 10/04/2016 CLINICAL DATA:  Chest pain this afternoon. Chest pressure radiates the left. EXAM: PORTABLE CHEST 1 VIEW COMPARISON:  None. FINDINGS: Heart is borderline enlarged. There is aortic atherosclerosis without aneurysm. Mild diffuse interstitial prominence is noted with peribronchial thickening which may reflect bronchitic change. There is atelectasis at each base. Lung volumes are slightly low. No suspicious osseous abnormality. IMPRESSION: Mild interstitial prominence with peribronchial thickening may reflect prior bronchitic change. Aortic atherosclerosis with borderline cardiomegaly. Electronically Signed   By: Tollie Eth M.D.   On: 10/04/2016 22:22    Cardiac Studies   Echo (pending, ordered 2016-10-13)  Patient Profile     83 yr old woman with hypertension and dementia admitted with NSTEMI from nursing home. DNR but family agreeable to reverse for possible cath.  Assessment & Plan    1. NSTEMI: Hemodynamically and symptomatically stable. On IV heparin and nitro  (30 mcg/min). Continue ASA, Lipitor, and metoprolol (increase to 25 mg bid). I ordered a 2-D echocardiogram with Doppler on 2016-10-13 to evaluate cardiac structure, function, and regional wall motion. It has yet to be performed. Troponins 2.69-->4.81-->6.26. I have tentatively arranged coronary angiography for 10/07/16.   2. Hypertension: Elevated, recently started on metoprolol. I will increase to 25 mg bid.  3. Hyperlipidemia: On Lipitor.  4. Dementia  5. Hypothyroidism: I restarted levothyroxine. TSH 2.99.  Code status: DNR (family ok to reverse this for cath if needed)  Signed, Prentice Docker, MD  10/06/2016, 8:25 AM

## 2016-10-06 NOTE — Progress Notes (Signed)
  Echocardiogram 2D Echocardiogram has been performed.  Arvil Chaco 10/06/2016, 3:07 PM

## 2016-10-06 NOTE — Progress Notes (Signed)
ANTICOAGULATION CONSULT NOTE   Pharmacy Consult for Heparin Indication: chest pain/ACS  No Known Allergies  Patient Measurements: Height: 5\' 3"  (160 cm) Weight: 158 lb (71.7 kg) IBW/kg (Calculated) : 52.4 Heparin Dosing Weight: 68 kg  Vital Signs: Temp: 98.6 F (37 C) (01/01 0847) Temp Source: Oral (01/01 0847) BP: 146/66 (01/01 0845) Pulse Rate: 76 (01/01 0847)  Labs:  Recent Labs  10/04/16 2153 10/05/16 0108 10/05/16 0649 10/05/16 1326 10/06/16 0153  HGB 13.2  --  11.9*  --  11.8*  HCT 40.1  --  36.8  --  35.8*  PLT 173  --  145*  --  168  APTT 32  --   --   --   --   LABPROT 12.3  --  13.6  --   --   INR 0.91  --  1.03  --   --   HEPARINUNFRC  --   --  0.55  --  0.54  CREATININE 1.27*  --  1.10*  --   --   TROPONINI  --  2.69* 4.81* 6.26*  --     Estimated Creatinine Clearance: 31 mL/min (by C-G formula based on SCr of 1.1 mg/dL (H)).   Assessment: 83 yo F with NSTEMI to continue on heparin gtt. Heparin level therapeutic at 0.54, CBC stable, no bleeding documented.  Goal of Therapy:  Heparin level 0.3-0.7 units/ml Monitor platelets by anticoagulation protocol: Yes   Plan:  Continue heparin gtt at 850 units/hr Daily heparin level and CBC Monitor for s/sx of bleeding   Mackie Pai, PharmD PGY1 Pharmacy Resident Pager: [phone] 10/06/2016 9:54 AM

## 2016-10-07 ENCOUNTER — Encounter (HOSPITAL_COMMUNITY)
Admission: EM | Disposition: A | Discharge status: Skilled Nursing Facility | Payer: Self-pay | Source: Home / Self Care | Attending: Cardiovascular Disease

## 2016-10-07 DIAGNOSIS — I10 Essential (primary) hypertension: Secondary | ICD-10-CM

## 2016-10-07 DIAGNOSIS — F0390 Unspecified dementia without behavioral disturbance: Secondary | ICD-10-CM

## 2016-10-07 DIAGNOSIS — R509 Fever, unspecified: Secondary | ICD-10-CM

## 2016-10-07 DIAGNOSIS — F0151 Vascular dementia with behavioral disturbance: Secondary | ICD-10-CM

## 2016-10-07 LAB — CBC
HEMATOCRIT: 38.2 % (ref 36.0–46.0)
HEMOGLOBIN: 12.2 g/dL (ref 12.0–15.0)
MCH: 28.6 pg (ref 26.0–34.0)
MCHC: 31.9 g/dL (ref 30.0–36.0)
MCV: 89.5 fL (ref 78.0–100.0)
Platelets: 127 10*3/uL — ABNORMAL LOW (ref 150–400)
RBC: 4.27 MIL/uL (ref 3.87–5.11)
RDW: 14.6 % (ref 11.5–15.5)
WBC: 5.6 10*3/uL (ref 4.0–10.5)

## 2016-10-07 LAB — GLUCOSE, CAPILLARY
GLUCOSE-CAPILLARY: 106 mg/dL — AB (ref 65–99)
GLUCOSE-CAPILLARY: 143 mg/dL — AB (ref 65–99)
GLUCOSE-CAPILLARY: 142 mg/dL — AB (ref 65–99)
GLUCOSE-CAPILLARY: 105 mg/dL — AB (ref 65–99)

## 2016-10-07 LAB — INFLUENZA PANEL BY PCR (TYPE A & B)
Influenza A By PCR: NEGATIVE
Influenza B By PCR: NEGATIVE

## 2016-10-07 LAB — HEPARIN LEVEL (UNFRACTIONATED): Heparin Unfractionated: 0.61 IU/mL (ref 0.30–0.70)

## 2016-10-07 LAB — TROPONIN I: Troponin I: 2.12 ng/mL (ref <0.03)

## 2016-10-07 SURGERY — LEFT HEART CATH AND CORONARY ANGIOGRAPHY
Anesthesia: LOCAL

## 2016-10-07 NOTE — Plan of Care (Signed)
Problem: Safety: Goal: Ability to remain free from injury will improve Outcome: Progressing Pt has a sitter in room. Fall risk bundle in place. Bed alarm on, non-skid socks on, hourly rounding performed. Pt frequently reoriented. Pt was only alert to self upon assessment. Will continue to monitor and assess and anticipate pt needs.   Problem: Skin Integrity: Goal: Risk for impaired skin integrity will decrease Outcome: Progressing No new signs of skin breakdown present. Pt is active, ambulates with assistance to bedside commode, turns herself in bed, dangles at the edge of the bed with assistance. Will continue to monitor pt and keep skin free from injury this shift.

## 2016-10-07 NOTE — Progress Notes (Signed)
ANTICOAGULATION CONSULT NOTE - Follow Up Consult  Pharmacy Consult for heparin Indication: chest pain/ACS  No Known Allergies  Patient Measurements: Height: 5\' 3"  (160 cm) Weight: 156 lb 4.9 oz (70.9 kg) IBW/kg (Calculated) : 52.4  Vital Signs: Temp: 98.4 F (36.9 C) (01/02 0810) Temp Source: Oral (01/02 0810) BP: 137/59 (01/02 0959) Pulse Rate: 67 (01/02 0959)  Labs:  Recent Labs  10/04/16 2153  10/05/16 0649 10/05/16 1326 10/06/16 0153 10/07/16 0924  HGB 13.2  --  11.9*  --  11.8* 12.2  HCT 40.1  --  36.8  --  35.8* 38.2  PLT 173  --  145*  --  168 127*  APTT 32  --   --   --   --   --   LABPROT 12.3  --  13.6  --   --   --   INR 0.91  --  1.03  --   --   --   HEPARINUNFRC  --   --  0.55  --  0.54 0.61  CREATININE 1.27*  --  1.10*  --   --   --   TROPONINI  --   < > 4.81* 6.26*  --  2.12*  < > = values in this interval not displayed.  Estimated Creatinine Clearance: 30.8 mL/min (by C-G formula based on SCr of 1.1 mg/dL (H)).   Medications:  Scheduled:  . aspirin EC  81 mg Oral Daily  . atorvastatin  80 mg Oral q1800  . levothyroxine  75 mcg Oral QAC breakfast  . metoprolol tartrate  25 mg Oral BID  . sodium chloride flush  3 mL Intravenous Q12H    Assessment: 83yo female with NSTEMI, plans for cath cancelled and proceed with medical management.  Heparin to continue x additional 24hr.  Heparin level is therapeutic, but up some & will back off on rate a bit.  Hg is stable, pltc dec but c/w previous results.  No bleeding noted.  Goal of Therapy:  Heparin level 0.3-0.7 units/ml Monitor platelets by anticoagulation protocol: Yes   Plan:  Decrease heparin to 800 units/hr F/U in AM Watch for s/s of bleeding   Marisue Humble, PharmD Clinical Pharmacist [facility]

## 2016-10-07 NOTE — Progress Notes (Signed)
Patient Name: Bonnie [name] Date of Encounter: 10/07/2016  Primary Cardiologist: [facility] Problem List     Principal Problem:   NSTEMI (non-ST elevated myocardial infarction) [facility]) Active Problems:   Essential hypertension   Dementia   Fever     Subjective   Had fever of 101.1 at 5Am. Given Tylenol. Now temp is normal. Pt has severe dementia and unable to tell how see is feeling. No nurse note regarding this. Daughter at bed side.   Inpatient Medications    Scheduled Meds: . aspirin EC  81 mg Oral Daily  . atorvastatin  80 mg Oral q1800  . levothyroxine  75 mcg Oral QAC breakfast  . metoprolol tartrate  25 mg Oral BID  . sodium chloride flush  3 mL Intravenous Q12H   Continuous Infusions: . sodium chloride    . heparin 850 Units/hr (10/06/16 0038)  . nitroGLYCERIN 30 mcg/min (10/06/16 0037)   PRN Meds: sodium chloride, acetaminophen, nitroGLYCERIN, ondansetron (ZOFRAN) IV, sodium chloride flush   Vital Signs    Vitals:   10/06/16 2007 10/06/16 2122 10/07/16 0500 10/07/16 0600  BP: (!) 158/59  (!) 154/59   Pulse: 65 89 78   Resp: 18  18   Temp: 98.1 F (36.7 C)  (!) 101.1 F (38.4 C) 98.5 F (36.9 C)  TempSrc: Oral  Oral Oral  SpO2: 92%  90%   Weight:   156 lb 4.9 oz (70.9 kg)   Height:        Intake/Output Summary (Last 24 hours) at 10/07/16 0809 Last data filed at 10/07/16 0500  Gross per 24 hour  Intake              315 ml  Output                0 ml  Net              315 ml   Filed Weights   10/04/16 2233 10/05/16 0246 10/07/16 0500  Weight: 167 lb 8.8 oz (76 kg) 158 lb (71.7 kg) 156 lb 4.9 oz (70.9 kg)    Physical Exam   GEN: elderly demented female in no acute distress.  HEENT: Grossly normal.  Neck: Supple, no JVD, carotid bruits, or masses. Cardiac: RRR, no murmurs, rubs, or gallops. No clubbing, cyanosis, edema.  Radials/DP/PT 2+ and equal bilaterally.  Respiratory:  Respirations regular and unlabored, clear to auscultation  bilaterally. GI: Soft, nontender, nondistended, BS + x 4. MS: no deformity or atrophy. Skin: warm and dry, no rash. Neuro:  Strength and sensation are intact. PsychFelicity [name] to name only  Labs    CBC  Recent Labs  10/04/16 2153 10/05/16 0649 10/06/16 0153  WBC 8.6 7.3 6.9  NEUTROABS 5.4  --   --   HGB 13.2 11.9* 11.8*  HCT 40.1 36.8 35.8*  MCV 87.0 88.9 86.1  PLT 173 145* 168   Basic Metabolic Panel  Recent Labs  10/04/16 2153 10/05/16 0649  NA 139 140  K 4.4 4.4  CL 108 106  CO2 24 26  GLUCOSE 102* 98  BUN 25* 20  CREATININE 1.27* 1.10*  CALCIUM 9.5 9.2   Liver Function Tests  Recent Labs  10/04/16 2153  AST 24  ALT 11*  ALKPHOS 85  BILITOT 0.5  PROT 6.8  ALBUMIN 3.5   No results for input(s): LIPASE, AMYLASE in the last 72 hours. Cardiac Enzymes  Recent Labs  10/05/16 0108 10/05/16 0649 10/05/16 1326  TROPONINI  2.69* 4.81* 6.26*   BNP Invalid input(s): POCBNP D-Dimer No results for input(s): DDIMER in the last 72 hours. Hemoglobin A1C No results for input(s): HGBA1C in the last 72 hours. Fasting Lipid Panel  Recent Labs  10/05/16 0530  CHOL 256*  HDL 58  LDLCALC 175*  TRIG 115  CHOLHDL 4.4   Thyroid Function Tests  Recent Labs  10/05/16 0530  TSH 2.998    Telemetry    Sinus rhythm with rate mostly in 60-80s intermittently goes into 130s  - Personally Reviewed  ECG    N/A  Radiology    No results found.  Cardiac Studies  Echo 10/07/15  LV EF: 55% -   60%  ------------------------------------------------------------------- Indications:      MI - acute 410.91.  ------------------------------------------------------------------- History:   Risk factors:  Hypertension. Dyslipidemia.  ------------------------------------------------------------------- Study Conclusions  - Left ventricle: The cavity size was normal. Systolic function was   normal. The estimated ejection fraction was in the range of 55%   to 60%.  Wall motion was normal; there were no regional wall   motion abnormalities. Features are consistent with a pseudonormal   left ventricular filling pattern, with concomitant abnormal   relaxation and increased filling pressure (grade 2 diastolic   dysfunction). Doppler parameters are consistent with high   ventricular filling pressure. - Aortic valve: Mildly to moderately calcified annulus. Trileaflet;   mildly thickened, mildly calcified leaflets. There was mild   regurgitation. - Mitral valve: Calcified annulus. There was mild regurgitation. - Left atrium: The atrium was moderately dilated. - Atrial septum: There was increased thickness of the septum,   consistent with lipomatous hypertrophy. - Pulmonary arteries: PA peak pressure: 50 mm Hg (S).  Impressions:  - The right ventricular systolic pressure was increased consistent   with moderate pulmonary hypertension.   Patient Profile     83 yo female with history of hypertension, hypothyroidism and dementia who presents from her nursing home with chest and left arm pain. Initially EMS activated for STEMI, but EKG with only subtle (<11mm) elevations in I, aVL, V2). No indication for urgent cath. DNR but family agreeable to reverse for possible cath.  Assessment & Plan    1.  NSTEMI:  On IV heparin and nitro (30 mcg/min). Continue ASA, Lipitor, and metoprolol (increase to 25 mg bid). Troponins 2.69-->4.81-->6.26 (last check 12/31).  Coronary angiography today. Echo showed normal LV function, grade 2 DD. The right ventricular systolic pressure was increased consistent with moderate pulmonary hypertension.  2. Hypertension: Stable on increased dose of metoprolol to 25 mg bid.  3. Hyperlipidemia: On Lipitor. 10/05/2016: Cholesterol 256; HDL 58; LDL Cholesterol 175; Triglycerides 115; VLDL 23  4. Dementia  5. Hypothyroidism: Restarted levothyroxine this admission. TSH 2.99. F/u with PCP.   6. Fever: Had fever of 101.1 at 5Am.  Given Tylenol. Now temp is normal. Pt has severe dementia. Unable to described how she is feeling. However, per daughter pt has intermittent dry cough. Lung clear. CXR on admission :mild interstitial prominence with peribronchial thickening may reflect prior bronchitic change". Will repeat CXR and get UA if return of elevated temperature.   Keep NPO for cath until seen by MD.   7. Sinus arrhythmia -  Intermittently rate goes into 130s. Looks like sinus tachycardia vs SVT. Continue BB.    Code status: DNR (family ok to reverse this for cath if needed)  Signed, [place], PA  10/07/2016, 8:09 AM   Personally seen and examined. Agree with above.  83 year old  living in memory care unit, dementia, DO NOT RESUSCITATE with non-ST elevation myocardial infarction, troponin 6  Non-ST elevation myocardial infarction  - Had lengthy conversation with daughter, Geraldine Contras. Given her advanced age, advanced dementia, and currently no complaints of chest discomfort, I decided to continue with aggressive medical management, noninvasive approach, i.e. no cardiac catheterization. First do no harm. Daughter understands. She is in agreement. She also had a temperature of 101 earlier this morning. Her extremities feel quite warm currently. I will check a flu PCR. When asked what is bothering her, she says everything. Her daughter states that his is been her typical response over the past few years. Thankfully, she is not complaining of any chest discomfort. On admission, she did have extremely high blood pressure at the nursing home. This has improved. We will go ahead and wean off nitroglycerin. I've explained statin use in the setting of MI, her daughter had leg pain with Lipitor, we will place her on this during this setting to optimize medical management. Low-dose beta blocker. Echocardiogram pending.  - Also clearly explained to the daughter, Geraldine Contras, but given her age of 72 and non-ST elevation myocardial infarction,  she is at high risk for morbidity/mortality over the next several weeks.  - We will continue with heparin IV over the next 24 hours  - I will check another troponin  - Placed on droplet precautions in case influenza is present.  Donato Schultz, MD

## 2016-10-08 LAB — CBC
HEMATOCRIT: 36.6 % (ref 36.0–46.0)
HEMOGLOBIN: 12 g/dL (ref 12.0–15.0)
MCH: 28.6 pg (ref 26.0–34.0)
MCHC: 32.8 g/dL (ref 30.0–36.0)
MCV: 87.1 fL (ref 78.0–100.0)
Platelets: 151 10*3/uL (ref 150–400)
RBC: 4.2 MIL/uL (ref 3.87–5.11)
RDW: 14.2 % (ref 11.5–15.5)
WBC: 5.8 10*3/uL (ref 4.0–10.5)

## 2016-10-08 LAB — GLUCOSE, CAPILLARY
GLUCOSE-CAPILLARY: 104 mg/dL — AB (ref 65–99)
Glucose-Capillary: 99 mg/dL (ref 65–99)

## 2016-10-08 LAB — HEPARIN LEVEL (UNFRACTIONATED): HEPARIN UNFRACTIONATED: 0.34 [IU]/mL (ref 0.30–0.70)

## 2016-10-08 MED ORDER — LEVOTHYROXINE SODIUM 75 MCG PO TABS: 75.0000 ug | ORAL_TABLET | Freq: Every day | ORAL | 1 refills | Status: AC

## 2016-10-08 MED ORDER — NITROGLYCERIN 0.4 MG SL SUBL: 0.4000 mg | SUBLINGUAL_TABLET | SUBLINGUAL | 12 refills | Status: AC | PRN

## 2016-10-08 MED ORDER — ATORVASTATIN CALCIUM 40 MG PO TABS: 40.0000 mg | ORAL_TABLET | Freq: Every day | ORAL | Status: DC

## 2016-10-08 MED ORDER — ATORVASTATIN CALCIUM 40 MG PO TABS: 40.0000 mg | ORAL_TABLET | Freq: Every day | ORAL | 6 refills | Status: AC

## 2016-10-08 MED ORDER — ASPIRIN 81 MG PO TBEC: 81.0000 mg | DELAYED_RELEASE_TABLET | Freq: Every day | ORAL | 6 refills | Status: AC

## 2016-10-08 MED ORDER — CARVEDILOL 3.125 MG PO TABS
3.1250 mg | ORAL_TABLET | Freq: Every day | ORAL | Status: DC
  Administered 2016-10-08: 3.125 mg via ORAL
  Filled 2016-10-08: qty 1

## 2016-10-08 NOTE — Discharge Summary (Signed)
Discharge Summary    Patient ID: Bonnie [name],  MRN: [id number], DOB/AGE: 1922/09/10 83 y.o.  Admit date: 10/04/2016 Discharge date: 10/08/2016  Primary Care Provider: No PCP Per Patient Primary Cardiologist: New to Dr. Purvis Sheffield  Discharge Diagnoses    Principal Problem:   NSTEMI (non-ST elevated myocardial infarction) [facility]) Active Problems:   Essential hypertension   Dementia   Fever   Allergies No Known Allergies  Diagnostic Studies/Procedures    Echo 10/06/16 Study Conclusions  - Left ventricle: The cavity size was normal. Systolic function was   normal. The estimated ejection fraction was in the range of 55%   to 60%. Wall motion was normal; there were no regional wall   motion abnormalities. Features are consistent with a pseudonormal   left ventricular filling pattern, with concomitant abnormal   relaxation and increased filling pressure (grade 2 diastolic   dysfunction). Doppler parameters are consistent with high   ventricular filling pressure. - Aortic valve: Mildly to moderately calcified annulus. Trileaflet;   mildly thickened, mildly calcified leaflets. There was mild   regurgitation. - Mitral valve: Calcified annulus. There was mild regurgitation. - Left atrium: The atrium was moderately dilated. - Atrial septum: There was increased thickness of the septum,   consistent with lipomatous hypertrophy. - Pulmonary arteries: PA peak pressure: 50 mm Hg (S).  Impressions:  - The right ventricular systolic pressure was increased consistent   with moderate pulmonary hypertension.   History of Present Illness     83 yo female with history of hypertension, hypothyroidism and dementia who presented 10/04/16 from her nursing home with chest and left arm pain.  She reported chest pressure and left arm pain to her nurses and thus EMS was called. Initially EMS activated for STEMI, but EKG with only subtle (<33mm) elevations in I, aVL, V2). The patient was  unable to provide consistent details about her chest pain, at time present as pressure while moments later without any complaints or pain. In ED, she was comfortable with a BP of 202/84. Troponin is mildly elevated at 0.83.  Dr. Okey Dupre spoken with patient's daughter and healthcare POA, Maximino Sarin.  She states that her mother's dementia is quite advanced but that she would want invasive procedures if necessary. Cancelled emergent LHC given the patient's vague symptoms and non-specific EKG changes that do not meed STEMI criteria. DNR but family agreeable to reverse for possible cath.  Hospital Course     Consultants: none.   The patient was admitted and started on IV heparin and IV nitro. Cath tentatively arrange coronary angiography for 10/07/16. However fever spiked to 101.1 around 5am at day of cath leading to cancellation. Given Tylenol. No further high temperature. Flu PCR was normal. Troponins 2.69-->4.81-->6.26-->2.12 (then discontinued heparin and IV nitro at night of 10/08/15). No further chest pain. Echo showed normal LV function, grade 2 DD. The right ventricular systolic pressure was increased consistent with moderate pulmonary hypertension.   Dr. Anne Fu had lengthy conversation with daughter, Geraldine Contras. Given her advanced age, advanced dementia, and currently no complaints of chest discomfort, decided to continue with aggressive medical management, noninvasive approach, i.e. no cardiac catheterization. First do no harm. Daughter understands. She is in agreement.   She was started on coreg 3.150mb BID (home dose) and statin to optimize medical therapy. BP mildly elevated at times. up titration as outpatient.   Due to elevated TSH 2.99 . Does adjusted of  Levothyroxine. F/u with PCP.   The patient has been seen by  Dr.  Anne Fu today and deemed ready for discharge home. All follow-up appointments have been scheduled. Discharge medications are listed below.   DC back to memory care unit. May follow up  with PCP/ memory care MD. We are available if needed.   Discharge Vitals Blood pressure (!) 150/53, pulse (!) 55, temperature 98.3 F (36.8 C), temperature source Oral, resp. rate 19, height 5\' 3"  (1.6 m), weight 156 lb 4.8 oz (70.9 kg), SpO2 99 %.  Filed Weights   10/05/16 0246 10/07/16 0500 10/08/16 0434  Weight: 158 lb (71.7 kg) 156 lb 4.9 oz (70.9 kg) 156 lb 4.8 oz (70.9 kg)    Labs & Radiologic Studies     CBC  Recent Labs  10/07/16 0924 10/08/16 0614  WBC 5.6 5.8  HGB 12.2 12.0  HCT 38.2 36.6  MCV 89.5 87.1  PLT 127* 151   Basic Metabolic Panel No results for input(s): NA, K, CL, CO2, GLUCOSE, BUN, CREATININE, CALCIUM, MG, PHOS in the last 72 hours. Liver Function Tests No results for input(s): AST, ALT, ALKPHOS, BILITOT, PROT, ALBUMIN in the last 72 hours. No results for input(s): LIPASE, AMYLASE in the last 72 hours. Cardiac Enzymes  Recent Labs  10/05/16 1326 10/07/16 0924  TROPONINI 6.26* 2.12*   BNP Invalid input(s): POCBNP D-Dimer No results for input(s): DDIMER in the last 72 hours. Hemoglobin A1C No results for input(s): HGBA1C in the last 72 hours. Fasting Lipid Panel No results for input(s): CHOL, HDL, LDLCALC, TRIG, CHOLHDL, LDLDIRECT in the last 72 hours. Thyroid Function Tests No results for input(s): TSH, T4TOTAL, T3FREE, THYROIDAB in the last 72 hours.  Invalid input(s): FREET3  Dg Chest Port 1 View  Result Date: 10/04/2016 CLINICAL DATA:  Chest pain this afternoon. Chest pressure radiates the left. EXAM: PORTABLE CHEST 1 VIEW COMPARISON:  None. FINDINGS: Heart is borderline enlarged. There is aortic atherosclerosis without aneurysm. Mild diffuse interstitial prominence is noted with peribronchial thickening which may reflect bronchitic change. There is atelectasis at each base. Lung volumes are slightly low. No suspicious osseous abnormality. IMPRESSION: Mild interstitial prominence with peribronchial thickening may reflect prior bronchitic  change. Aortic atherosclerosis with borderline cardiomegaly. Electronically Signed   By: Tollie Eth M.D.   On: 10/04/2016 22:22    Disposition   Pt is being discharged home today in good condition.  Follow-up Plans & Appointments    Follow-up Information    PCP/ memory care MD. Schedule an appointment as soon as possible for a visit in 3 day(s).   Why:  for post hospital          Discharge Instructions    Diet - low sodium heart healthy    Complete by:  As directed    Discharge instructions    Complete by:  As directed    Follow up with PCP/ memory care MD. Cardiology are available if needed.   Increase activity slowly    Complete by:  As directed       Discharge Medications   Current Discharge Medication List    START taking these medications   Details  aspirin EC 81 MG EC tablet Take 1 tablet (81 mg total) by mouth daily. Qty: 30 tablet, Refills: 6    atorvastatin (LIPITOR) 40 MG tablet Take 1 tablet (40 mg total) by mouth daily at 6 PM. Qty: 30 tablet, Refills: 6    nitroGLYCERIN (NITROSTAT) 0.4 MG SL tablet Place 1 tablet (0.4 mg total) under the tongue every 5 (five) minutes x 3  doses as needed for chest pain. Qty: 30 tablet, Refills: 12      CONTINUE these medications which have CHANGED   Details  levothyroxine (SYNTHROID, LEVOTHROID) 75 MCG tablet Take 1 tablet (75 mcg total) by mouth daily. Needs PCP visit for further refills Qty: 30 tablet, Refills: 1      CONTINUE these medications which have NOT CHANGED   Details  acetaminophen (TYLENOL) 500 MG tablet Take 500 mg by mouth 2 (two) times daily.    albuterol (PROVENTIL HFA;VENTOLIN HFA) 108 (90 Base) MCG/ACT inhaler Inhale 2 puffs into the lungs every 6 (six) hours as needed for wheezing or shortness of breath.    carvedilol (COREG) 6.25 MG tablet Take 3.125 mg by mouth daily.    loperamide (IMODIUM A-D) 2 MG tablet Take 2 mg by mouth every 8 (eight) hours as needed for diarrhea or loose stools.      ranitidine (ZANTAC) 150 MG tablet Take 150 mg by mouth daily as needed for heartburn.    sertraline (ZOLOFT) 50 MG tablet Take 50 mg by mouth daily.           Outstanding Labs/Studies   TSH in 4 weeks Consider OP f/u labs 6-8 weeks given statin initiation this admission.  Duration of Discharge Encounter   Greater than 30 minutes including physician time.  Signed, Bhagat,Bhavinkumar PA-C 10/08/2016, 1:04 PM  Personally seen and examined. Agree with above.  Hospital Problem List     Principal Problem:   NSTEMI (non-ST elevated myocardial infarction) [facility]) Active Problems:   Essential hypertension   Dementia   Fever     Subjective   Per daughter, no change. She states everything bothers her. This has been response for past few years daughter states.    Inpatient Medications    Scheduled Meds: . aspirin EC  81 mg Oral Daily  . atorvastatin  80 mg Oral q1800  . carvedilol  3.125 mg Oral Daily  . levothyroxine  75 mcg Oral QAC breakfast  . sodium chloride flush  3 mL Intravenous Q12H   Continuous Infusions:  PRN Meds: sodium chloride, acetaminophen, nitroGLYCERIN, ondansetron (ZOFRAN) IV, sodium chloride flush   Vital Signs          Vitals:   10/08/16 0422 10/08/16 0434 10/08/16 0746 10/08/16 0857  BP: (!) 176/54 (!) 163/54  (!) 171/61  Pulse: 65  69 66  Resp: 17 19    Temp: 98.5 F (36.9 C)  98.1 F (36.7 C)   TempSrc: Oral  Axillary   SpO2: 91%  99%   Weight:  156 lb 4.8 oz (70.9 kg)    Height:        Intake/Output Summary (Last 24 hours) at 10/08/16 1104 Last data filed at 10/08/16 0839  Gross per 24 hour  Intake          1199.31 ml  Output              450 ml  Net           749.31 ml        Filed Weights   10/05/16 0246 10/07/16 0500 10/08/16 0434  Weight: 158 lb (71.7 kg) 156 lb 4.9 oz (70.9 kg) 156 lb 4.8 oz (70.9 kg)    Physical Exam   GEN: elderly demented female in no acute distress.  HEENT:  Grossly normal.  Neck: Supple, no JVD, carotid bruits, or masses. Cardiac: RRR, no murmurs, rubs, or gallops. No clubbing, cyanosis, edema.  Radials/DP/PT 2+ and  equal bilaterally.  Respiratory:  Respirations regular and unlabored, clear to auscultation bilaterally. GI: Soft, nontender, nondistended, BS + x 4. MS: no deformity or atrophy. Skin: warm and dry, no rash. Neuro:  Strength and sensation are intact. Psych: AAO to name only  Labs    CBC  Recent Labs (last 2 labs)    Recent Labs  10/07/16 0924 10/08/16 0614  WBC 5.6 5.8  HGB 12.2 12.0  HCT 38.2 36.6  MCV 89.5 87.1  PLT 127* 151     Cardiac Enzymes  Recent Labs (last 2 labs)    Recent Labs  10/05/16 1326 10/07/16 0924  TROPONINI 6.26* 2.12*       Telemetry    Sinus rhythm with rate mostly in 60-80s intermittently goes into 130s brief PAT  - Personally Reviewed  ECG    N/A  Radiology    Imaging Results (Last 48 hours)  No results found.    Cardiac Studies   Echo 10/07/15  LV EF: 55% - 60%  - Left ventricle: The cavity size was normal. Systolic function wasnormal. The estimated ejection fraction was in the range of 55%to 60%. Wall motion was normal; there were no regional wallmotion abnormalities. Features are consistent with a pseudonormalleft ventricular filling pattern, with concomitant abnormalrelaxation and increased filling pressure (grade 2 diastolicdysfunction). Doppler parameters are consistent with highventricular filling pressure. - Aortic valve: Mildly to moderately calcified annulus. Trileaflet; mildly thickened, mildly calcified leaflets. There was mild regurgitation. - Mitral valve: Calcified annulus. There was mild regurgitation. - Left atrium: The atrium was moderately dilated. - Atrial septum: There was increased thickness of the septum, consistent with lipomatous hypertrophy. - Pulmonary arteries: PA peak pressure: 50 mm Hg  (S).  Impressions:  - The right ventricular systolic pressure was increased consistent with moderate pulmonary hypertension.   Patient Profile     Assessment & Plan     83 year old living in memory care unit, dementia, DO NOT RESUSCITATE with non-ST elevation myocardial infarction, troponin 6 down 2  Non-ST elevation myocardial infarction  - Had lengthy conversation with daughter, Geraldine Contras. Given her advanced age, advanced dementia, and currently no complaints of chest discomfort, I decided to continue with aggressive medical management, noninvasive approach, i.e. no cardiac catheterization. First do no harm. Daughter understands. She is in agreement. No further temp.  When asked what is bothering her, she says everything. Her daughter states that his is been her typical response over the past few years. Thankfully, she is not complaining of any chest discomfort. On admission, she did have extremely high blood pressure at the nursing home. This has improved. Off nitroglycerin. I've explained statin use in the setting of MI, her daughter had leg pain with Lipitor, we will place her on this during this setting to optimize medical management. Low-dose beta blocker as she was taking at home, coreg 3.125 BID. BP mildly elevated at times. Echocardiogram normal EF.   - Also clearly explained to the daughter, Geraldine Contras, but given her age of 35 and non-ST elevation myocardial infarction, she is at high risk for morbidity/mortality over the next several weeks.  - Flu PCR neg  - Tried to call daughter but did not answer.   OK with DC back to memory care unit. May follow up with PCP/ memory care MD. We are available if needed.   Donato Schultz, MD

## 2016-10-08 NOTE — Care Management Important Message (Signed)
Important Message  Patient Details  Name: Bonnie [name] MRN: [id number] Date of Birth: 09-04-1922   Medicare Important Message Given:  Yes    Kyla Balzarine 10/08/2016, 11:49 AM

## 2016-10-08 NOTE — Progress Notes (Signed)
CSW informed pt ready for DC- faxed DC and signed fl2 to ALF and they are able to pick patient up today.  Patient will discharge to [facility] Anticipated discharge date: 1/3 Family notified: pt dtr notified by facility Transportation by facility- request patient be at main entrance at 3pm  CSW signing off.  Burna Sis, LCSW Clinical Social Worker [phone]

## 2016-10-08 NOTE — Progress Notes (Signed)
Patient Name: Bonnie [name] Date of Encounter: 10/08/2016  Primary Cardiologist: [facility] Problem List     Principal Problem:   NSTEMI (non-ST elevated myocardial infarction) [facility]) Active Problems:   Essential hypertension   Dementia   Fever     Subjective   Per daughter, no change. She states everything bothers her. This has been response for past few years daughter states.    Inpatient Medications    Scheduled Meds: . aspirin EC  81 mg Oral Daily  . atorvastatin  80 mg Oral q1800  . carvedilol  3.125 mg Oral Daily  . levothyroxine  75 mcg Oral QAC breakfast  . sodium chloride flush  3 mL Intravenous Q12H   Continuous Infusions:  PRN Meds: sodium chloride, acetaminophen, nitroGLYCERIN, ondansetron (ZOFRAN) IV, sodium chloride flush   Vital Signs    Vitals:   10/08/16 0422 10/08/16 0434 10/08/16 0746 10/08/16 0857  BP: (!) 176/54 (!) 163/54  (!) 171/61  Pulse: 65  69 66  Resp: 17 19    Temp: 98.5 F (36.9 C)  98.1 F (36.7 C)   TempSrc: Oral  Axillary   SpO2: 91%  99%   Weight:  156 lb 4.8 oz (70.9 kg)    Height:        Intake/Output Summary (Last 24 hours) at 10/08/16 1104 Last data filed at 10/08/16 0839  Gross per 24 hour  Intake          1199.31 ml  Output              450 ml  Net           749.31 ml   Filed Weights   10/05/16 0246 10/07/16 0500 10/08/16 0434  Weight: 158 lb (71.7 kg) 156 lb 4.9 oz (70.9 kg) 156 lb 4.8 oz (70.9 kg)    Physical Exam   GEN: elderly demented female in no acute distress.  HEENT: Grossly normal.  Neck: Supple, no JVD, carotid bruits, or masses. Cardiac: RRR, no murmurs, rubs, or gallops. No clubbing, cyanosis, edema.  Radials/DP/PT 2+ and equal bilaterally.  Respiratory:  Respirations regular and unlabored, clear to auscultation bilaterally. GI: Soft, nontender, nondistended, BS + x 4. MS: no deformity or atrophy. Skin: warm and dry, no rash. Neuro:  Strength and sensation are intact. PsychFelicity Coyer to name  only  Labs    CBC  Recent Labs  10/07/16 0924 10/08/16 0614  WBC 5.6 5.8  HGB 12.2 12.0  HCT 38.2 36.6  MCV 89.5 87.1  PLT 127* 151   Cardiac Enzymes  Recent Labs  10/05/16 1326 10/07/16 0924  TROPONINI 6.26* 2.12*     Telemetry    Sinus rhythm with rate mostly in 60-80s intermittently goes into 130s brief PAT  - Personally Reviewed  ECG    N/A  Radiology    No results found.  Cardiac Studies   Echo 10/07/15  LV EF: 55% -   60%  - Left ventricle: The cavity size was normal. Systolic function was  normal. The estimated ejection fraction was in the range of 55%  to 60%. Wall motion was normal; there were no regional wall  motion abnormalities. Features are consistent with a pseudonormal  left ventricular filling pattern, with concomitant abnormal  relaxation and increased filling pressure (grade 2 diastolic  dysfunction). Doppler parameters are consistent with high  ventricular filling pressure. - Aortic valve: Mildly to moderately calcified annulus. Trileaflet;   mildly thickened, mildly calcified leaflets. There was mild  regurgitation. - Mitral valve: Calcified annulus. There was mild regurgitation. - Left atrium: The atrium was moderately dilated. - Atrial septum: There was increased thickness of the septum,   consistent with lipomatous hypertrophy. - Pulmonary arteries: PA peak pressure: 50 mm Hg (S).  Impressions:  - The right ventricular systolic pressure was increased consistent   with moderate pulmonary hypertension.   Patient Profile     Assessment & Plan     83 year old living in memory care unit, dementia, DO NOT RESUSCITATE with non-ST elevation myocardial infarction, troponin 6 down 2  Non-ST elevation myocardial infarction  - Had lengthy conversation with daughter, Geraldine Contras. Given her advanced age, advanced dementia, and currently no complaints of chest discomfort, I decided to continue with aggressive medical management, noninvasive  approach, i.e. no cardiac catheterization. First do no harm. Daughter understands. She is in agreement. No further temp.  When asked what is bothering her, she says everything. Her daughter states that his is been her typical response over the past few years. Thankfully, she is not complaining of any chest discomfort. On admission, she did have extremely high blood pressure at the nursing home. This has improved. Off nitroglycerin. I've explained statin use in the setting of MI, her daughter had leg pain with Lipitor, we will place her on this during this setting to optimize medical management. Low-dose beta blocker as she was taking at home, coreg 3.125 BID. BP mildly elevated at times. Echocardiogram normal EF.   - Also clearly explained to the daughter, Geraldine Contras, but given her age of 9 and non-ST elevation myocardial infarction, she is at high risk for morbidity/mortality over the next several weeks.  - Flu PCR neg  - Tried to call daughter but did not answer.   OK with DC back to memory care unit. May follow up with PCP/ memory care MD. We are available if needed.   Donato Schultz, MD

## 2016-10-08 NOTE — NC FL2 (Signed)
[place] MEDICAID FL2 LEVEL OF CARE SCREENING TOOL     IDENTIFICATION  Patient Name: Bonnie [name] Birthdate: [birth year] Sex: female Admission Date (Current Location): 10/04/2016  [facility] and [place] Number:  Producer, television/film/video and Address:  [facility], [address] [place], [place] [address]      Provider Number: [id number]  Attending Physician Name and Address:  Laqueta Linden, MD  Relative Name and Phone Number:       Current Level of Care: Hospital Recommended Level of Care: Assisted Living Facility, Memory Care Prior Approval Number:    Date Approved/Denied:   PASRR Number:    Discharge Plan: Other (Comment) (Memory Care- ALF)    Current Diagnoses: Patient Active Problem List   Diagnosis Date Noted  . Essential hypertension 10/07/2016  . Dementia 10/07/2016  . Fever 10/07/2016  . NSTEMI (non-ST elevated myocardial infarction) (HCC) 10/04/2016    Orientation RESPIRATION BLADDER Height & Weight     Self  Normal Continent Weight: 156 lb 4.8 oz (70.9 kg) Height:  5\' 3"  (160 cm)  BEHAVIORAL SYMPTOMS/MOOD NEUROLOGICAL BOWEL NUTRITION STATUS      Continent Diet (dime sized pieces of food)  AMBULATORY STATUS COMMUNICATION OF NEEDS Skin   Limited Assist Verbally Normal                       Personal Care Assistance Level of Assistance  Bathing, Dressing Bathing Assistance: Limited assistance   Dressing Assistance: Limited assistance     Functional Limitations Info             SPECIAL CARE FACTORS FREQUENCY                       Contractures      Additional Factors Info  Code Status, Allergies Code Status Info: DNR Allergies Info: NKA           Current Medications (10/08/2016):  This is the current hospital active medication list Current Facility-Administered Medications  Medication Dose Route Frequency Provider Last Rate Last Dose  . 0.9 %  sodium chloride infusion  250 mL Intravenous PRN  Ok Anis, NP      . acetaminophen (TYLENOL) tablet 650 mg  650 mg Oral Q4H PRN Wilma Flavin, MD   650 mg at 10/07/16 0544  . aspirin EC tablet 81 mg  81 mg Oral Daily Wilma Flavin, MD   81 mg at 10/08/16 0858  . atorvastatin (LIPITOR) tablet 40 mg  40 mg Oral q1800 Jake Bathe, MD      . carvedilol (COREG) tablet 3.125 mg  3.125 mg Oral Daily Jake Bathe, MD   3.125 mg at 10/08/16 1216  . levothyroxine (SYNTHROID, LEVOTHROID) tablet 75 mcg  75 mcg Oral QAC breakfast Laqueta Linden, MD   75 mcg at 10/08/16 0858  . nitroGLYCERIN (NITROSTAT) SL tablet 0.4 mg  0.4 mg Sublingual Q5 [name] x 3 PRN Wilma Flavin, MD      . ondansetron [facility]) injection 4 mg  4 mg Intravenous Q6H PRN Wilma Flavin, MD   4 mg at 10/06/16 0852  . sodium chloride flush (NS) 0.9 % injection 3 mL  3 mL Intravenous Q12H Ok Anis, NP   3 mL at 10/07/16 2237  . sodium chloride flush (NS) 0.9 % injection 3 mL  3 mL Intravenous PRN Ok Anis, NP  Discharge Medications: Please see discharge summary for a list of discharge medications.  Relevant Imaging Results:  Relevant Lab Results:   Additional Information    Burna Sis, LCSW

## 2017-03-03 ENCOUNTER — Emergency Department (HOSPITAL_COMMUNITY): Payer: Medicare Other

## 2017-03-03 ENCOUNTER — Emergency Department (HOSPITAL_COMMUNITY)
Admission: EM | Admit: 2017-03-03 | Discharge: 2017-03-03 | Disposition: A | Discharge status: Home / Self Care | Payer: Medicare Other | Attending: Emergency Medicine | Admitting: Emergency Medicine

## 2017-03-03 ENCOUNTER — Encounter (HOSPITAL_COMMUNITY): Payer: Self-pay

## 2017-03-03 DIAGNOSIS — I10 Essential (primary) hypertension: Secondary | ICD-10-CM | POA: Diagnosis not present

## 2017-03-03 DIAGNOSIS — E039 Hypothyroidism, unspecified: Secondary | ICD-10-CM | POA: Insufficient documentation

## 2017-03-03 DIAGNOSIS — Z79899 Other long term (current) drug therapy: Secondary | ICD-10-CM | POA: Insufficient documentation

## 2017-03-03 DIAGNOSIS — R072 Precordial pain: Secondary | ICD-10-CM | POA: Insufficient documentation

## 2017-03-03 DIAGNOSIS — I252 Old myocardial infarction: Secondary | ICD-10-CM | POA: Diagnosis not present

## 2017-03-03 DIAGNOSIS — R079 Chest pain, unspecified: Secondary | ICD-10-CM

## 2017-03-03 DIAGNOSIS — Z7982 Long term (current) use of aspirin: Secondary | ICD-10-CM | POA: Insufficient documentation

## 2017-03-03 LAB — BASIC METABOLIC PANEL
Anion gap: 9 (ref 5–15)
BUN: 20 mg/dL (ref 6–20)
CHLORIDE: 108 mmol/L (ref 101–111)
CO2: 22 mmol/L (ref 22–32)
Calcium: 9.4 mg/dL (ref 8.9–10.3)
Creatinine, Ser: 1.23 mg/dL — ABNORMAL HIGH (ref 0.44–1.00)
GFR, EST AFRICAN AMERICAN: 43 mL/min — AB (ref >60)
GFR, EST NON AFRICAN AMERICAN: 37 mL/min — AB (ref >60)
Glucose, Bld: 103 mg/dL — ABNORMAL HIGH (ref 65–99)
POTASSIUM: 4.4 mmol/L (ref 3.5–5.1)
SODIUM: 139 mmol/L (ref 135–145)

## 2017-03-03 LAB — I-STAT TROPONIN, ED
TROPONIN I, POC: 0.01 ng/mL (ref 0.00–0.08)
Troponin i, poc: 0 ng/mL (ref 0.00–0.08)

## 2017-03-03 LAB — CBC
HEMATOCRIT: 39.4 % (ref 36.0–46.0)
Hemoglobin: 12.7 g/dL (ref 12.0–15.0)
MCH: 28.7 pg (ref 26.0–34.0)
MCHC: 32.2 g/dL (ref 30.0–36.0)
MCV: 88.9 fL (ref 78.0–100.0)
Platelets: 168 10*3/uL (ref 150–400)
RBC: 4.43 MIL/uL (ref 3.87–5.11)
RDW: 14.3 % (ref 11.5–15.5)
WBC: 4.8 10*3/uL (ref 4.0–10.5)

## 2017-03-03 MED ORDER — ONDANSETRON HCL 4 MG/2ML IJ SOLN
4.0000 mg | Freq: Once | INTRAMUSCULAR | Status: AC
  Administered 2017-03-03: 4 mg via INTRAVENOUS
  Filled 2017-03-03: qty 2

## 2017-03-03 NOTE — Discharge Instructions (Signed)
Return to the ER if symptoms significantly worsen or change.  Continue medications as previously prescribed.

## 2017-03-03 NOTE — ED Triage Notes (Signed)
Pt from [facility] [place] assisted [facility]. Pt has chest pain this morning at 0830 was given 1 nitro and 324mg  ASA pt no just complaining of nausea.

## 2017-03-03 NOTE — ED Notes (Signed)
Called report to [facility]

## 2017-03-03 NOTE — ED Provider Notes (Signed)
[facility]-EMERGENCY DEPT Provider Note   CSN: [id number] Arrival date & time: 03/03/17  1029     History   Chief Complaint Chief Complaint  Patient presents with  . Chest Pain    HPI Rafael Quesada is a 83 y.o. female.  Patient is a 83 year old female with past mental history of coronary artery disease, hypertension, and dementia. She presents today for evaluation of chest pain. She was sent from her extended care facility for evaluation of this. She states that she began to have pressure in her chest while she was getting herself dressed this morning. She was given 1 nitroglycerin, then sent here for further workup. Her pain has since resolved and now her only complaint is feeling nauseated.   The history is provided by the patient.  Chest Pain   This is a recurrent problem. The current episode started 1 to 2 hours ago. The problem occurs constantly. The problem has been resolved. The pain is present in the substernal region. The pain is moderate. The quality of the pain is described as pressure-like. The pain does not radiate. Associated symptoms include nausea. Pertinent negatives include no diaphoresis and no shortness of breath. She has tried nitroglycerin for the symptoms. The treatment provided significant relief.    Past Medical History:  Diagnosis Date  . Dementia   . Hypertension   . Hypothyroid     Patient Active Problem List   Diagnosis Date Noted  . Essential hypertension 10/07/2016  . Dementia 10/07/2016  . Fever 10/07/2016  . NSTEMI (non-ST elevated myocardial infarction) (HCC) 10/04/2016    History reviewed. No pertinent surgical history.  OB History    No data available       Home Medications    Prior to Admission medications   Medication Sig Start Date End Date Taking? Authorizing Provider  acetaminophen (TYLENOL) 500 MG tablet Take 500 mg by mouth 2 (two) times daily.    [provider]  albuterol (PROVENTIL HFA;VENTOLIN HFA) 108 (90 Base)  MCG/ACT inhaler Inhale 2 puffs into the lungs every 6 (six) hours as needed for wheezing or shortness of breath.    [provider]  aspirin EC 81 MG EC tablet Take 1 tablet (81 mg total) by mouth daily. 10/09/16   Bhagat, Sharrell Ku, PA  atorvastatin (LIPITOR) 40 MG tablet Take 1 tablet (40 mg total) by mouth daily at 6 PM. 10/08/16   Bhagat, Bhavinkumar, PA  carvedilol (COREG) 6.25 MG tablet Take 3.125 mg by mouth daily.    [provider]  levothyroxine (SYNTHROID, LEVOTHROID) 75 MCG tablet Take 1 tablet (75 mcg total) by mouth daily. Needs PCP visit for further refills 10/08/16   Bhagat, [place], PA  loperamide (IMODIUM A-D) 2 MG tablet Take 2 mg by mouth every 8 (eight) hours as needed for diarrhea or loose stools.    [provider]  nitroGLYCERIN (NITROSTAT) 0.4 MG SL tablet Place 1 tablet (0.4 mg total) under the tongue every 5 (five) minutes x 3 doses as needed for chest pain. 10/08/16   Bhagat, Sharrell Ku, PA  ranitidine (ZANTAC) 150 MG tablet Take 150 mg by mouth daily as needed for heartburn.    [provider]  sertraline (ZOLOFT) 50 MG tablet Take 50 mg by mouth daily.    [provider]    Family History History reviewed. No pertinent family history.  Social History Social History  Substance Use Topics  . Smoking status: Never Smoker  . Smokeless tobacco: Never Used  . Alcohol use  No     Allergies   Patient has no known allergies.   Review of Systems Review of Systems  Constitutional: Negative for diaphoresis.  Respiratory: Negative for shortness of breath.   Cardiovascular: Positive for chest pain.  Gastrointestinal: Positive for nausea.  All other systems reviewed and are negative.    Physical Exam Updated Vital Signs BP (!) 176/68 (BP Location: Left Arm)   Pulse 68 Comment: Afib  Temp 98.1 F (36.7 C) (Oral)   SpO2 96%   Physical Exam  Constitutional: She is oriented to person, place, and time. She appears  well-developed and well-nourished. No distress.  HENT:  Head: Normocephalic and atraumatic.  Neck: Normal range of motion. Neck supple.  Cardiovascular: Normal rate and regular rhythm.  Exam reveals no gallop and no friction rub.   No murmur heard. Pulmonary/Chest: Effort normal and breath sounds normal. No respiratory distress. She has no wheezes.  Abdominal: Soft. Bowel sounds are normal. She exhibits no distension. There is no tenderness.  Musculoskeletal: Normal range of motion.  Neurological: She is alert and oriented to person, place, and time.  Skin: Skin is warm and dry. She is not diaphoretic.  Nursing note and vitals reviewed.    ED Treatments / Results  Labs (all labs ordered are listed, but only abnormal results are displayed) Labs Reviewed  BASIC METABOLIC PANEL  CBC  I-STAT TROPOININ, ED    EKG  EKG Interpretation  Date/Time:  Tuesday Mar 03 2017 10:34:54 EDT Ventricular Rate:  69 PR Interval:    QRS Duration: 103 QT Interval:  403 QTC Calculation: 432 R Axis:   -7 Text Interpretation:  Sinus rhythm Atrial premature complex Prolonged PR interval Probable lateral infarct, age indeterminate Probable anteroseptal infarct, old Confirmed by Geoffery Lyons ([id number]) on 03/03/2017 10:44:50 AM       Radiology No results found.  Procedures Procedures (including critical care time)  Medications Ordered in ED Medications  ondansetron (ZOFRAN) injection 4 mg (4 mg Intravenous Given 03/03/17 1043)     Initial Impression / Assessment and Plan / ED Course  I have reviewed the triage vital signs and the nursing notes.  Pertinent labs & imaging results that were available during my care of the patient were reviewed by me and considered in my medical decision making (see chart for details).  Patient sent from her extended care facility for evaluation of chest pain. This resolved after nitroglycerin. Her troponin 2 is negative. Upon reviewing her record. She was  recently in the hospital under the care of cardiology after suffering a non-STEMI. The decision at that time was made not to pursue intervention and to maximize medical therapy. There is no indication today a cardiac etiology. She will be discharged to her extended care facility and is to follow-up as needed.  Final Clinical Impressions(s) / ED Diagnoses   Final diagnoses:  None    New Prescriptions New Prescriptions   No medications on file     Geoffery Lyons, MD 03/03/17 1535

## 2017-03-26 ENCOUNTER — Emergency Department (HOSPITAL_COMMUNITY): Payer: Medicare Other

## 2017-03-26 ENCOUNTER — Encounter (HOSPITAL_COMMUNITY): Payer: Self-pay | Admitting: *Deleted

## 2017-03-26 ENCOUNTER — Inpatient Hospital Stay (HOSPITAL_COMMUNITY)
Admission: EM | Admit: 2017-03-26 | Discharge: 2017-04-05 | DRG: 061 | Disposition: E | Payer: Medicare Other | Attending: Neurology | Admitting: Neurology

## 2017-03-26 DIAGNOSIS — I4891 Unspecified atrial fibrillation: Secondary | ICD-10-CM | POA: Diagnosis present

## 2017-03-26 DIAGNOSIS — I252 Old myocardial infarction: Secondary | ICD-10-CM | POA: Diagnosis not present

## 2017-03-26 DIAGNOSIS — F0280 Dementia in other diseases classified elsewhere without behavioral disturbance: Secondary | ICD-10-CM | POA: Diagnosis not present

## 2017-03-26 DIAGNOSIS — E785 Hyperlipidemia, unspecified: Secondary | ICD-10-CM | POA: Diagnosis present

## 2017-03-26 DIAGNOSIS — R29810 Facial weakness: Secondary | ICD-10-CM | POA: Diagnosis present

## 2017-03-26 DIAGNOSIS — R402353 Coma scale, best motor response, localizes pain, at hospital admission: Secondary | ICD-10-CM | POA: Diagnosis present

## 2017-03-26 DIAGNOSIS — Z79899 Other long term (current) drug therapy: Secondary | ICD-10-CM | POA: Diagnosis not present

## 2017-03-26 DIAGNOSIS — I498 Other specified cardiac arrhythmias: Secondary | ICD-10-CM | POA: Diagnosis not present

## 2017-03-26 DIAGNOSIS — I10 Essential (primary) hypertension: Secondary | ICD-10-CM | POA: Diagnosis present

## 2017-03-26 DIAGNOSIS — F0390 Unspecified dementia without behavioral disturbance: Secondary | ICD-10-CM | POA: Diagnosis present

## 2017-03-26 DIAGNOSIS — Z66 Do not resuscitate: Secondary | ICD-10-CM | POA: Diagnosis present

## 2017-03-26 DIAGNOSIS — Z7982 Long term (current) use of aspirin: Secondary | ICD-10-CM

## 2017-03-26 DIAGNOSIS — I360 Nonrheumatic tricuspid (valve) stenosis: Secondary | ICD-10-CM | POA: Diagnosis not present

## 2017-03-26 DIAGNOSIS — G301 Alzheimer's disease with late onset: Secondary | ICD-10-CM | POA: Diagnosis not present

## 2017-03-26 DIAGNOSIS — G935 Compression of brain: Secondary | ICD-10-CM

## 2017-03-26 DIAGNOSIS — R29726 NIHSS score 26: Secondary | ICD-10-CM | POA: Diagnosis present

## 2017-03-26 DIAGNOSIS — E039 Hypothyroidism, unspecified: Secondary | ICD-10-CM | POA: Diagnosis present

## 2017-03-26 DIAGNOSIS — I63231 Cerebral infarction due to unspecified occlusion or stenosis of right carotid arteries: Secondary | ICD-10-CM | POA: Diagnosis not present

## 2017-03-26 DIAGNOSIS — R401 Stupor: Secondary | ICD-10-CM | POA: Diagnosis not present

## 2017-03-26 DIAGNOSIS — I639 Cerebral infarction, unspecified: Secondary | ICD-10-CM | POA: Diagnosis not present

## 2017-03-26 DIAGNOSIS — W19XXXA Unspecified fall, initial encounter: Secondary | ICD-10-CM | POA: Diagnosis present

## 2017-03-26 DIAGNOSIS — I370 Nonrheumatic pulmonary valve stenosis: Secondary | ICD-10-CM | POA: Diagnosis not present

## 2017-03-26 DIAGNOSIS — E782 Mixed hyperlipidemia: Secondary | ICD-10-CM | POA: Diagnosis not present

## 2017-03-26 DIAGNOSIS — Z8673 Personal history of transient ischemic attack (TIA), and cerebral infarction without residual deficits: Secondary | ICD-10-CM | POA: Diagnosis not present

## 2017-03-26 DIAGNOSIS — G936 Cerebral edema: Secondary | ICD-10-CM | POA: Diagnosis present

## 2017-03-26 DIAGNOSIS — Z515 Encounter for palliative care: Secondary | ICD-10-CM | POA: Diagnosis present

## 2017-03-26 DIAGNOSIS — R402113 Coma scale, eyes open, never, at hospital admission: Secondary | ICD-10-CM | POA: Diagnosis present

## 2017-03-26 DIAGNOSIS — I214 Non-ST elevation (NSTEMI) myocardial infarction: Secondary | ICD-10-CM

## 2017-03-26 DIAGNOSIS — G8194 Hemiplegia, unspecified affecting left nondominant side: Secondary | ICD-10-CM | POA: Diagnosis present

## 2017-03-26 DIAGNOSIS — I63411 Cerebral infarction due to embolism of right middle cerebral artery: Secondary | ICD-10-CM | POA: Diagnosis present

## 2017-03-26 DIAGNOSIS — G911 Obstructive hydrocephalus: Secondary | ICD-10-CM | POA: Diagnosis present

## 2017-03-26 DIAGNOSIS — R402213 Coma scale, best verbal response, none, at hospital admission: Secondary | ICD-10-CM | POA: Diagnosis present

## 2017-03-26 DIAGNOSIS — J189 Pneumonia, unspecified organism: Secondary | ICD-10-CM

## 2017-03-26 LAB — CBC
HEMATOCRIT: 38.4 % (ref 36.0–46.0)
HEMOGLOBIN: 12.4 g/dL (ref 12.0–15.0)
MCH: 28.6 pg (ref 26.0–34.0)
MCHC: 32.3 g/dL (ref 30.0–36.0)
MCV: 88.5 fL (ref 78.0–100.0)
Platelets: 167 10*3/uL (ref 150–400)
RBC: 4.34 MIL/uL (ref 3.87–5.11)
RDW: 14.1 % (ref 11.5–15.5)
WBC: 6.1 10*3/uL (ref 4.0–10.5)

## 2017-03-26 LAB — I-STAT CHEM 8, ED
BUN: 46 mg/dL — ABNORMAL HIGH (ref 6–20)
CREATININE: 1.2 mg/dL — AB (ref 0.44–1.00)
Calcium, Ion: 1.12 mmol/L — ABNORMAL LOW (ref 1.15–1.40)
Chloride: 109 mmol/L (ref 101–111)
GLUCOSE: 104 mg/dL — AB (ref 65–99)
HEMATOCRIT: 37 % (ref 36.0–46.0)
HEMOGLOBIN: 12.6 g/dL (ref 12.0–15.0)
POTASSIUM: 6.1 mmol/L — AB (ref 3.5–5.1)
Sodium: 140 mmol/L (ref 135–145)
TCO2: 26 mmol/L (ref 0–100)

## 2017-03-26 LAB — DIFFERENTIAL
BASOS PCT: 0 %
Basophils Absolute: 0 10*3/uL (ref 0.0–0.1)
EOS ABS: 0.2 10*3/uL (ref 0.0–0.7)
EOS PCT: 3 %
LYMPHS ABS: 3 10*3/uL (ref 0.7–4.0)
Lymphocytes Relative: 49 %
MONO ABS: 0.3 10*3/uL (ref 0.1–1.0)
MONOS PCT: 5 %
Neutro Abs: 2.6 10*3/uL (ref 1.7–7.7)
Neutrophils Relative %: 43 %

## 2017-03-26 LAB — CBG MONITORING, ED: Glucose-Capillary: 103 mg/dL — ABNORMAL HIGH (ref 65–99)

## 2017-03-26 LAB — COMPREHENSIVE METABOLIC PANEL
ALT: 10 U/L — ABNORMAL LOW (ref 14–54)
ANION GAP: 7 (ref 5–15)
AST: 18 U/L (ref 15–41)
Albumin: 3.9 g/dL (ref 3.5–5.0)
Alkaline Phosphatase: 82 U/L (ref 38–126)
BILIRUBIN TOTAL: 0.5 mg/dL (ref 0.3–1.2)
BUN: 28 mg/dL — AB (ref 6–20)
CHLORIDE: 110 mmol/L (ref 101–111)
CO2: 24 mmol/L (ref 22–32)
Calcium: 9 mg/dL (ref 8.9–10.3)
Creatinine, Ser: 1.27 mg/dL — ABNORMAL HIGH (ref 0.44–1.00)
GFR, EST AFRICAN AMERICAN: 41 mL/min — AB (ref >60)
GFR, EST NON AFRICAN AMERICAN: 36 mL/min — AB (ref >60)
Glucose, Bld: 98 mg/dL (ref 65–99)
POTASSIUM: 4.9 mmol/L (ref 3.5–5.1)
Sodium: 141 mmol/L (ref 135–145)
TOTAL PROTEIN: 6.3 g/dL — AB (ref 6.5–8.1)

## 2017-03-26 LAB — ETHANOL

## 2017-03-26 LAB — PROTIME-INR
INR: 0.99
Prothrombin Time: 13.1 seconds (ref 11.4–15.2)

## 2017-03-26 LAB — I-STAT TROPONIN, ED: TROPONIN I, POC: 0.15 ng/mL — AB (ref 0.00–0.08)

## 2017-03-26 LAB — APTT: APTT: 31 s (ref 24–36)

## 2017-03-26 MED ORDER — SODIUM CHLORIDE 0.9 % IV SOLN
1.0000 g | Freq: Once | INTRAVENOUS | Status: DC
  Filled 2017-03-26: qty 10

## 2017-03-26 MED ORDER — SODIUM CHLORIDE 0.9 % IV SOLN
INTRAVENOUS | Status: DC
  Administered 2017-03-26: 20:00:00 via INTRAVENOUS

## 2017-03-26 MED ORDER — PIPERACILLIN-TAZOBACTAM 3.375 G IVPB 30 MIN
3.3750 g | Freq: Once | INTRAVENOUS | Status: AC
  Administered 2017-03-26: 3.375 g via INTRAVENOUS
  Filled 2017-03-26: qty 50

## 2017-03-26 MED ORDER — ALTEPLASE (STROKE) FULL DOSE INFUSION
0.9000 mg/kg | Freq: Once | INTRAVENOUS | Status: AC
  Administered 2017-03-26: 63 mg via INTRAVENOUS
  Filled 2017-03-26: qty 100

## 2017-03-26 MED ORDER — STROKE: EARLY STAGES OF RECOVERY BOOK
Freq: Once | Status: DC
  Filled 2017-03-26: qty 1

## 2017-03-26 MED ORDER — ACETAMINOPHEN 650 MG RE SUPP: 650.0000 mg | RECTAL | Status: DC | PRN

## 2017-03-26 MED ORDER — ACETAMINOPHEN 325 MG PO TABS: 650.0000 mg | ORAL_TABLET | ORAL | Status: DC | PRN

## 2017-03-26 MED ORDER — PANTOPRAZOLE SODIUM 40 MG IV SOLR
40.0000 mg | Freq: Every day | INTRAVENOUS | Status: DC
  Administered 2017-03-26 – 2017-03-27 (×2): 40 mg via INTRAVENOUS
  Filled 2017-03-26 (×2): qty 40

## 2017-03-26 MED ORDER — ACETAMINOPHEN 160 MG/5ML PO SOLN: 650.0000 mg | ORAL | Status: DC | PRN

## 2017-03-26 MED ORDER — SODIUM BICARBONATE 8.4 % IV SOLN: 50.0000 meq | Freq: Once | INTRAVENOUS | Status: DC

## 2017-03-26 MED ORDER — VANCOMYCIN HCL IN DEXTROSE 1-5 GM/200ML-% IV SOLN
1000.0000 mg | Freq: Once | INTRAVENOUS | Status: AC
  Administered 2017-03-26: 1000 mg via INTRAVENOUS
  Filled 2017-03-26: qty 200

## 2017-03-26 MED ORDER — CLEVIDIPINE BUTYRATE 0.5 MG/ML IV EMUL
0.0000 mg/h | INTRAVENOUS | Status: DC
  Administered 2017-03-26: 1 mg/h via INTRAVENOUS
  Administered 2017-03-28: 3 mg/h via INTRAVENOUS
  Administered 2017-03-28: 2 mg/h via INTRAVENOUS
  Filled 2017-03-26 (×3): qty 50

## 2017-03-26 NOTE — Progress Notes (Signed)
Pt arrived to unit with cervical collar in place. Notified on-call neurology MD to ask whether c-collar could be removed as there is no order for it to remain and cervical spine CT stated "no abnormality to cervical spine." Informed via MD that c-collar could be removed at this time.  Will continue to monitor closely.  Francia Greaves, RN

## 2017-03-26 NOTE — ED Triage Notes (Signed)
Pt arrives from [facility] via [place] [place]. Pt was last seen at 1630 walking around and was found around 1700 by staff on the floor. Pt is neglecting her left side and has a left sided facial droop. Pt is vomiting upon arrival and has no control over her saliva.

## 2017-03-26 NOTE — ED Provider Notes (Signed)
[facility]PT Provider Note   CSN: [id number] Arrival date & time: 03/11/2017  1738   An emergency department physician performed an initial assessment on this suspected stroke patient at 38.  History   Chief Complaint Chief Complaint  Patient presents with  . Code Stroke    HPI Bonnie [name] is a 83 y.o. female hx of dementia, HTN, Here presenting with weakness, Facial droop, strokelike symptoms. Patient is currently in the memory care unit due to dementia. Patient's last normal somewhere around 2 PM. She was noted to be not acting normal around 4:45 PM. She apparently walked and fell and was unable to get up. EMS noted L facial droop, L sided weakness and code stroke activated by EMS. Hx of NSTEMI several months ago, not on blood thinners currently.   The history is provided by the patient and the EMS personnel. The history is limited by the condition of the patient.   Level V caveat- dementia   Past Medical History:  Diagnosis Date  . Dementia   . Hypertension   . Hypothyroid     Patient Active Problem List   Diagnosis Date Noted  . CVA (cerebral vascular accident) (HCC) 03/25/2017  . Essential hypertension 10/07/2016  . Dementia 10/07/2016  . Fever 10/07/2016  . NSTEMI (non-ST elevated myocardial infarction) (HCC) 10/04/2016    History reviewed. No pertinent surgical history.  OB History    No data available       Home Medications    Prior to Admission medications   Medication Sig Start Date End Date Taking? Authorizing Provider  acetaminophen (TYLENOL) 500 MG tablet Take 500 mg by mouth 2 (two) times daily.    [provider]  albuterol (PROVENTIL HFA;VENTOLIN HFA) 108 (90 Base) MCG/ACT inhaler Inhale 2 puffs into the lungs every 6 (six) hours as needed for wheezing or shortness of breath.    [provider]  aspirin EC 81 MG EC tablet Take 1 tablet (81 mg total) by mouth daily. Patient not taking: Reported on 03/03/2017 10/09/16    Manson Passey, PA  atorvastatin (LIPITOR) 40 MG tablet Take 1 tablet (40 mg total) by mouth daily at 6 PM. Patient not taking: Reported on 03/03/2017 10/08/16   Manson Passey, PA  carvedilol (COREG) 6.25 MG tablet Take 3.125 mg by mouth daily.    [provider]  levothyroxine (SYNTHROID, LEVOTHROID) 25 MCG tablet Take 25-50 mcg by mouth See admin instructions. 25 mcg on Monday,tuesday, Wednesday,thursday. All other days are 50 mcg    [provider]  levothyroxine (SYNTHROID, LEVOTHROID) 75 MCG tablet Take 1 tablet (75 mcg total) by mouth daily. Needs PCP visit for further refills Patient not taking: Reported on 03/03/2017 10/08/16   Manson Passey, PA  loperamide (IMODIUM A-D) 2 MG tablet Take 2 mg by mouth every 8 (eight) hours as needed for diarrhea or loose stools.    [provider]  losartan (COZAAR) 50 MG tablet Take 50 mg by mouth daily.    [provider]  nitroGLYCERIN (NITROSTAT) 0.4 MG SL tablet Place 1 tablet (0.4 mg total) under the tongue every 5 (five) minutes x 3 doses as needed for chest pain. 10/08/16   Bhagat, Sharrell Ku, PA  ranitidine (ZANTAC) 150 MG tablet Take 150 mg by mouth daily as needed for heartburn.    [provider]  sertraline (ZOLOFT) 50 MG tablet Take 50 mg by mouth daily.    [provider]    Family History History reviewed. No pertinent family  history.  Social History Social History  Substance Use Topics  . Smoking status: Never Smoker  . Smokeless tobacco: Never Used  . Alcohol use No     Allergies   Patient has no known allergies.   Review of Systems Review of Systems  Neurological: Positive for weakness.  All other systems reviewed and are negative.    Physical Exam Updated Vital Signs BP (!) 183/74   Pulse (!) 57   Resp 12   Wt 70.2 kg (154 lb 12.2 oz)   SpO2 98%   BMI 27.42 kg/m   Physical Exam  Constitutional:  Chronically ill   HENT:  Head: Normocephalic.    Mouth/Throat: Oropharynx is clear and moist.  Eyes: Conjunctivae and EOM are normal. Pupils are equal, round, and reactive to light.  Neck:  C collar in place   Cardiovascular: Normal rate and regular rhythm.   Pulmonary/Chest: Effort normal and breath sounds normal. No respiratory distress. She has no wheezes.  Abdominal: Soft. Bowel sounds are normal.  Musculoskeletal: Normal range of motion.  Neurological: She is alert.  Alert. A & O x 1. + L facial droop. Strength 1/5 L arm and 4/5 R arm, 2/5 L leg, 4/5 R leg   Skin: Skin is warm.  Nursing note and vitals reviewed.    ED Treatments / Results  Labs (all labs ordered are listed, but only abnormal results are displayed) Labs Reviewed  COMPREHENSIVE METABOLIC PANEL - Abnormal; Notable for the following:       Result Value   BUN 28 (*)    Creatinine, Ser 1.27 (*)    Total Protein 6.3 (*)    ALT 10 (*)    GFR calc non Af Amer 36 (*)    GFR calc Af Amer 41 (*)    All other components within normal limits  I-STAT CHEM 8, ED - Abnormal; Notable for the following:    Potassium 6.1 (*)    BUN 46 (*)    Creatinine, Ser 1.20 (*)    Glucose, Bld 104 (*)    Calcium, Ion 1.12 (*)    All other components within normal limits  I-STAT TROPOININ, ED - Abnormal; Notable for the following:    Troponin i, poc 0.15 (*)    All other components within normal limits  CBG MONITORING, ED - Abnormal; Notable for the following:    Glucose-Capillary 103 (*)    All other components within normal limits  CULTURE, BLOOD (ROUTINE X 2)  CULTURE, BLOOD (ROUTINE X 2)  ETHANOL  PROTIME-INR  APTT  CBC  DIFFERENTIAL  RAPID URINE DRUG SCREEN, HOSP PERFORMED  URINALYSIS, ROUTINE W REFLEX MICROSCOPIC  HEMOGLOBIN A1C  LIPID PANEL  I-STAT CG4 LACTIC ACID, ED    EKG  EKG Interpretation  Date/Time:  Thursday 04/08/2017 17:53:41 EDT Ventricular Rate:  60 PR Interval:    QRS Duration: 127 QT Interval:  472 QTC Calculation: 472 R Axis:   60 Text  Interpretation:  Sinus rhythm Short PR interval Nonspecific intraventricular conduction delay Probable lateral infarct, age indeterminate No significant change since last tracing Confirmed by Richardean Canal [phone]) on 04/08/2017 5:55:59 PM       Radiology Ct Cervical Spine Wo Contrast  Result Date: 04/08/2017 CLINICAL DATA:  Left-sided weakness and fall EXAM: CT CERVICAL SPINE WITHOUT CONTRAST TECHNIQUE: Multidetector CT imaging of the cervical spine was performed without intravenous contrast. Multiplanar CT image reconstructions were also generated. COMPARISON:  None. FINDINGS: Alignment: Straightening of the normal  cervical lordosis. Grade 1 anterolisthesis at C7-T1. Normal facet alignment with multilevel facet hypertrophy, greatest at right C4-5 and C6-7 and left C3-4 and C4-5. Skull base and vertebrae: No acute fracture. The dens is intact. The occipital condyles and lateral masses of C1 and C2 are aligned. Soft tissues and spinal canal: No prevertebral fluid or swelling. No visible canal hematoma. Disc levels: No visible traumatic disc herniation. No high-grade spinal canal stenosis. Upper chest: Clear Other: None IMPRESSION: No acute abnormality of the cervical spine. Electronically Signed   By: Deatra Robinson M.D.   On: 2017/03/28 18:22   Dg Chest Port 1 View  Result Date: 2017-03-28 CLINICAL DATA:  Status post fall, with shortness of breath. Elevated troponin. Initial encounter. EXAM: PORTABLE CHEST 1 VIEW COMPARISON:  Chest radiograph performed 03/03/2017 FINDINGS: The lungs are well-aerated. Minimal left basilar opacity may reflect atelectasis or possibly mild infection. There is no evidence of pleural effusion or pneumothorax. The cardiomediastinal silhouette is borderline enlarged. No acute osseous abnormalities are seen. IMPRESSION: Minimal left basilar opacity may reflect atelectasis or possibly mild infection. Borderline cardiomegaly. No displaced rib fracture seen. Electronically Signed   By:  Roanna Raider M.D.   On: 2017/03/28 18:37   Ct Head Code Stroke W/o Cm  Result Date: 28-Mar-2017 CLINICAL DATA:  Code stroke.  Left-sided weakness EXAM: CT HEAD WITHOUT CONTRAST TECHNIQUE: Contiguous axial images were obtained from the base of the skull through the vertex without intravenous contrast. COMPARISON:  None FINDINGS: Brain: No mass lesion, intraparenchymal hemorrhage or extra-axial collection. There is diminished gray-white differentiation at the right basal ganglia and insula. There is periventricular hypoattenuation compatible with chronic microvascular disease. Vascular: Mildly hyperdense right middle cerebral artery. Skull: Normal visualized skull base, calvarium and extracranial soft tissues. Sinuses/Orbits: No sinus fluid levels or advanced mucosal thickening. No mastoid effusion. Normal orbits. ASPECTS [facility] Stroke Program Early CT Score) - Ganglionic level infarction (caudate, lentiform nuclei, internal capsule, insula, M1-M3 cortex): 5 - Supraganglionic infarction (M4-M6 cortex): 3 Total score (0-10 with 10 being normal): 8 IMPRESSION: 1. No acute intracranial hemorrhage. 2. Blurring of gray-white differentiation at the right basal ganglia of/the insula and hyperdense appearance of the right middle cerebral artery, suggestive of right MCA thromboembolism and/or right lenticulocapsular acute ischemia. 3. ASPECTS is 8. These results were called by telephone at the time of interpretation on 03/28/17 at 6:11 pm to Dr. Ritta Slot, who verbally acknowledged these results. Electronically Signed   By: Deatra Robinson M.D.   On: 28-Mar-2017 18:12    Procedures Procedures (including critical care time)  CRITICAL CARE Performed by: Richardean Canal   Total critical care time: 40  minutes  Critical care time was exclusive of separately billable procedures and treating other patients.  Critical care was necessary to treat or prevent imminent or life-threatening  deterioration.  Critical care was time spent personally by me on the following activities: development of treatment plan with patient and/or surrogate as well as nursing, discussions with consultants, evaluation of patient's response to treatment, examination of patient, obtaining history from patient or surrogate, ordering and performing treatments and interventions, ordering and review of laboratory studies, ordering and review of radiographic studies, pulse oximetry and re-evaluation of patient's condition.   Medications Ordered in ED Medications  clevidipine (CLEVIPREX) infusion 0.5 mg/mL (2 mg/hr Intravenous Rate/Dose Change 28-Mar-2017 1858)  alteplase (ACTIVASE) 1 mg/mL infusion 63 mg (63 mg Intravenous New Bag/Given 2017-03-28 1811)  sodium bicarbonate injection 50 mEq (not administered)  calcium gluconate 1 g  in sodium chloride 0.9 % 100 mL IVPB (not administered)   stroke: mapping our early stages of recovery book (not administered)  0.9 %  sodium chloride infusion (not administered)  acetaminophen (TYLENOL) tablet 650 mg (not administered)    Or  acetaminophen (TYLENOL) solution 650 mg (not administered)    Or  acetaminophen (TYLENOL) suppository 650 mg (not administered)  pantoprazole (PROTONIX) injection 40 mg (not administered)  vancomycin (VANCOCIN) IVPB 1000 mg/200 mL premix (not administered)  piperacillin-tazobactam (ZOSYN) IVPB 3.375 g (not administered)     Initial Impression / Assessment and Plan / ED Course  I have reviewed the triage vital signs and the nursing notes.  Pertinent labs & imaging results that were available during my care of the patient were reviewed by me and considered in my medical decision making (see chart for details).    Tavie Haseman is a 83 y.o. female here with code stroke. Has obvious L facial droop and L sided weakness. Neuro at bedside. Had a fall so will get CT neck in addition to CT head. Will get labs as well.   6:18 pm Patient's CT head  showed no bleed. Istat chem showed K 6.1. Will get full chemistry. Will give bicarb, calcium for now. Neuro ordered TPA. Patient on cleviprex drip since she is hypertensive 220/100.   7:07 PM Trop 0.15. Likely had NSTEMI as well. Patient given TPA so no ASA and heparin for now. CXR showed possible aspiration pneumonia. WBC nl. Added on lactate, culture. Will give vanc/zosyn empirically.      Final Clinical Impressions(s) / ED Diagnoses   Final diagnoses:  Stroke (cerebrum) (HCC)  Stroke (cerebrum) [facility])    New Prescriptions New Prescriptions   No medications on file     Charlynne Pander, MD 03/13/2017 [facility] Moment

## 2017-03-26 NOTE — Code Documentation (Signed)
Patient from [facility] unit.  Per staff she is normally rather independent.  This afternoon about 5pm she fell and was found by the staff on the floor.  EMS noted that the patient was leaning to the left and had a facial droop.SBP 250 Code Stroke was called enroute.  Upon arrival patient was vomiting and some snoring respirations.  DNR form noted.  Stat labs drawn Stat head CT done.  Dr Amada Jupiter spoke with staff at facility and confirmed she was LKW at 2pm.  He then spoke with her daughters and the decision was made to infuse TPA.  20mg  labetalol given while in the  CT scanner.  Upon return to Ed BP 192/72 Cleviprex started and titrated for BP 160-180.  TPA bolus given at 1811.  Patient admitted to 4 [facility]

## 2017-03-26 NOTE — H&P (Signed)
Neurology H&P  CC: Left sided weakness  History is obtained from: EMS  HPI: Bonnie [name] is a 83 y.o. female with a history of previous stroke, NSTEMI, who presents with acute onset left-sided weakness. She was sitting in the common room, but apparently was not acting completely her normal self around 445. She then stood up to go to her room and while she was there she fell. She was then brought in emergently as a code stroke. After EMS noted left-sided weakness.  After discussion with staff, they were uncertain when she was last seen well but they finally found a staff member who had a conversation with her at 2 PM and she was completely her normal self at that time.   LKW: 2pm tpa given?: yes Modified Rankin Score: 3  ROS: Unable to obtain due to altered mental status.   Past Medical History:  Diagnosis Date  . Dementia   . Hypertension   . Hypothyroid      FHx: Unable to assess secondary to patient's altered mental status.    Social History:  reports that she has never smoked. She has never used smokeless tobacco. She reports that she does not drink alcohol or use drugs.  Exam: Current vital signs: BP (!) 135/51   Pulse (!) 58   Resp (!) 22   Wt 70.2 kg (154 lb 12.2 oz)   SpO2 100%   BMI 27.42 kg/m  Vital signs in last 24 hours: Pulse Rate:  [53-60] 58 (06/21 1820) Resp:  [15-22] 22 (06/21 1820) BP: (129-190)/(51-61) 135/51 (06/21 1820) SpO2:  [100 %] 100 % (06/21 1820) Weight:  [70.2 kg (154 lb 12.2 oz)] 70.2 kg (154 lb 12.2 oz) (06/21 1757)  Physical Exam  Constitutional: Appears well-developed and well-nourished.  Psych: Affect appropriate to situation Eyes: No scleral injection HENT: No OP obstrucion Head: Normocephalic.  Cardiovascular: Normal rate and regular rhythm.  Respiratory: Effort normal and breath sounds normal to anterior ascultation GI: Soft.  No distension. There is no tenderness.  Skin: WDI  Neuro: Mental Status: Patient is Lethargic  but does follow commands. Cranial Nerves: II: No clear blink to threat from either direction. Pupils are equal, round, and reactive to light.   III,IV, VI: Right gaze preference V: She responds less on the left and right VII: Facial movement is notable for left facial weakness VIII: hearing is intact to voice X: Uvula elevates symmetrically XI: Shoulder shrug is symmetric. XII: tongue is midline without atrophy or fasciculations.  Motor: She moves her right side well, flexion to noxious stimuli in the left arm and leg Sensory: Response less on the left than the right Cerebellar: Does not perform  I have reviewed labs in epic and the results pertinent to this consultation are: CBC-unremarkable  I have reviewed the images obtained: CT head-no acute hemorrhage  Impression: 83 year old female with findings of right MCA infarction. She does not have any exclusions for IV TPA. I discussed the risks and benefits of IV TPA with the daughter who agreed to proceed. I also discussed intra-arterial intervention, in my opinion that in someone with advanced dementia this to be an extremely aggressive intervention and her daughter agreed that she would not 1 to go through an invasive procedure. She was DO NOT RESUSCITATE/DO NOT INTUBATE even prior to the hospital.   Recommendations: 1. HgbA1c, fasting lipid panel 2. MRI, MRA  of the brain without contrast 3. Frequent neuro checks 4. Echocardiogram 5. Carotid dopplers 6. Prophylactic therapy-Antiplatelet med: Aspirin -  dose 325mg  PO or 300mg  PR 7. Risk factor modification 8. Telemetry monitoring 9. PT consult, OT consult, Speech consult 10. please page stroke NP  Or  PA  Or MD  from 8am -4 pm as this patient will be followed by the stroke team at this point.   You can look them up on [email or web address]     This patient is critically ill and at significant risk of neurological worsening, death and care requires constant monitoring of vital signs,  hemodynamics,respiratory and cardiac monitoring, neurological assessment, discussion with family, other specialists and medical decision making of high complexity. I spent 45 minutes of neurocritical care time  in the care of  this patient.  Ritta Slot, MD [facility] [phone]  If 7pm- 7am, please page neurology on call as listed in AMION. 04-18-17  6:21 PM

## 2017-03-27 ENCOUNTER — Inpatient Hospital Stay (HOSPITAL_COMMUNITY): Payer: Medicare Other

## 2017-03-27 DIAGNOSIS — I360 Nonrheumatic tricuspid (valve) stenosis: Secondary | ICD-10-CM

## 2017-03-27 DIAGNOSIS — I10 Essential (primary) hypertension: Secondary | ICD-10-CM

## 2017-03-27 DIAGNOSIS — Z8673 Personal history of transient ischemic attack (TIA), and cerebral infarction without residual deficits: Secondary | ICD-10-CM

## 2017-03-27 DIAGNOSIS — I63231 Cerebral infarction due to unspecified occlusion or stenosis of right carotid arteries: Secondary | ICD-10-CM

## 2017-03-27 DIAGNOSIS — I370 Nonrheumatic pulmonary valve stenosis: Secondary | ICD-10-CM

## 2017-03-27 DIAGNOSIS — I498 Other specified cardiac arrhythmias: Secondary | ICD-10-CM

## 2017-03-27 DIAGNOSIS — F0390 Unspecified dementia without behavioral disturbance: Secondary | ICD-10-CM

## 2017-03-27 DIAGNOSIS — R401 Stupor: Secondary | ICD-10-CM

## 2017-03-27 DIAGNOSIS — E782 Mixed hyperlipidemia: Secondary | ICD-10-CM

## 2017-03-27 LAB — LIPID PANEL
CHOLESTEROL: 254 mg/dL — AB (ref 0–200)
HDL: 49 mg/dL (ref >40)
LDL Cholesterol: 184 mg/dL — ABNORMAL HIGH (ref 0–99)
Total CHOL/HDL Ratio: 5.2 RATIO
Triglycerides: 107 mg/dL (ref <150)
VLDL: 21 mg/dL (ref 0–40)

## 2017-03-27 LAB — MRSA PCR SCREENING: MRSA by PCR: NEGATIVE

## 2017-03-27 LAB — ECHOCARDIOGRAM COMPLETE: WEIGHTICAEL: 2476.21 [oz_av]

## 2017-03-27 MED ORDER — ORAL CARE MOUTH RINSE
15.0000 mL | Freq: Two times a day (BID) | OROMUCOSAL | Status: DC
  Administered 2017-03-27 – 2017-03-29 (×4): 15 mL via OROMUCOSAL

## 2017-03-27 MED ORDER — LABETALOL HCL 5 MG/ML IV SOLN
5.0000 mg | INTRAVENOUS | Status: AC | PRN
  Administered 2017-03-27 (×4): 5 mg via INTRAVENOUS
  Filled 2017-03-27 (×4): qty 4

## 2017-03-27 MED ORDER — DIPHENHYDRAMINE HCL 50 MG/ML IJ SOLN
25.0000 mg | Freq: Once | INTRAMUSCULAR | Status: AC
  Administered 2017-03-27: 25 mg via INTRAVENOUS
  Filled 2017-03-27: qty 1

## 2017-03-27 MED ORDER — IOPAMIDOL (ISOVUE-370) INJECTION 76%
INTRAVENOUS | Status: AC
  Administered 2017-03-27: 50 mL
  Filled 2017-03-27: qty 50

## 2017-03-27 NOTE — Progress Notes (Signed)
  Echocardiogram 2D Echocardiogram has been performed.  Bonnie [name] T Grisell Bissette 03/27/2017, 3:44 PM

## 2017-03-27 NOTE — Progress Notes (Signed)
OT Cancellation Note  Patient Details Name: Bonnie [name] MRN: [id number] DOB: 1921/11/24   Cancelled Treatment:    Reason Eval/Treat Not Completed: Patient not medically ready. Pt with active bedrest orders. Will await increase in activity orders prior to initiating OT evaluation. Thank you for this referral!  Doristine Section, MS OTR/L  Pager: [phone]]    Bonnie [name] 03/27/2017, 7:27 AM

## 2017-03-27 NOTE — Progress Notes (Signed)
PT Cancellation Note  Patient Details Name: Amara Denardis MRN: [id number] DOB: [birth year]   Cancelled Treatment:    Reason Eval/Treat Not Completed: Patient not medically ready. Pt currently on strict bedrest. Will continue to follow and complete PT eval when medically ready.    Marylynn Pearson 03/27/2017, 7:40 AM  Conni Slipper, PT, DPT Acute Rehabilitation Services Pager: [phone]

## 2017-03-27 NOTE — Progress Notes (Signed)
SLP Cancellation Note  Patient Details Name: Deshai Cruver MRN: [id number] DOB: 28-Jul-1922   Cancelled treatment:       Reason Eval/Treat Not Completed: Patient not medically ready per RN. Will continue to follow.   Maxcine Ham 03/27/2017, 9:21 AM  Maxcine Ham, M.A. CCC-SLP [phone]

## 2017-03-27 NOTE — Progress Notes (Signed)
STROKE TEAM PROGRESS NOTE   HISTORY OF PRESENT ILLNESS (per record) Bonnie [name] is a 83 y.o. female with a history of dementia, hypertension, hyperlipidemia, hyperhypothyroidism, NSTEMI, and a previous stroke who presented with acute onset left-sided weakness and left-sided facial droop.  She resides at [facility] assisted living, and was seen acting strangely in the common room there around 1630 on 04-11-2017.  She then stood up to go to her room, and while there she reportedly fell, and was found by staff around 1700.  EMS noted left-sided weakness and she was brought emergently to the ED as a code stroke.  In triage she had an episode of emesis and SBP of 250, and was started on a clevidipine infusion.  She received a tPA bolus at 1811.  CTA on 03/27/2017 revealed a large occlusion of the right ICA, distal right M1, right ACA, and right MCA, with diffuse right cerebral edema and 4mm of leftward shift.  The patient has a portable DNR order in place.  LSN 1400 on 2017/04/11.   Modified Rankin Score: 3  Patient was administered IV t-PA at 1811 on April 11, 2017. She was admitted to the neuro ICU for further evaluation and treatment.   SUBJECTIVE (INTERVAL HISTORY) Her family is not at the bedside.  Patient obtunded, eyes closed, not follow commands. CTA head and neck showed right distal M1 cut off, large right MCA and ACA infarcts. MRI pending.   OBJECTIVE Temp:  [97 F (36.1 C)-99.2 F (37.3 C)] 99.2 F (37.3 C) (06/22 1158) Pulse Rate:  [52-73] 63 (06/22 1100) Cardiac Rhythm: Atrial fibrillation (06/22 0800) Resp:  [11-28] 19 (06/22 1100) BP: (129-192)/(38-150) 171/64 (06/22 1100) SpO2:  [91 %-100 %] 97 % (06/22 1100) Weight:  [70.2 kg (154 lb 12.2 oz)] 70.2 kg (154 lb 12.2 oz) (06/21 1757)  CBC:  Recent Labs Lab 11-Apr-2017 1754 04/11/17 1801  WBC  --  6.1  NEUTROABS  --  2.6  HGB 12.6 12.4  HCT 37.0 38.4  MCV  --  88.5  PLT  --  167    Basic Metabolic Panel:  Recent Labs Lab  04/11/2017 1754 04-11-17 1801  NA 140 141  K 6.1* 4.9  CL 109 110  CO2  --  24  GLUCOSE 104* 98  BUN 46* 28*  CREATININE 1.20* 1.27*  CALCIUM  --  9.0    Lipid Panel:    Component Value Date/Time   CHOL 254 (H) 03/27/2017 0238   TRIG 107 03/27/2017 0238   HDL 49 03/27/2017 0238   CHOLHDL 5.2 03/27/2017 0238   VLDL 21 03/27/2017 0238   LDLCALC 184 (H) 03/27/2017 0238   HgbA1c: No results found for: HGBA1C Urine Drug Screen: No results found for: LABOPIA, COCAINSCRNUR, LABBENZ, AMPHETMU, THCU, LABBARB  Alcohol Level     Component Value Date/Time   ETH <5 04/11/2017 1801    IMAGING I have personally reviewed the radiological images below and agree with the radiology interpretations.  Ct Angio Head and neck W Or Wo Contrast 03/27/2017 IMPRESSION: 1. Occlusion/near complete occlusion of the distal intracranial right ICA. Distal right M1 occlusion and reduced right ACA flow with multiple filling defects. 2. Acute infarction involving the entirety of the right ACA and right MCA territories. Diffuse right cerebral hemispheric edema with 4 mm of leftward midline shift. 3. Atherosclerosis involving the intracranial and cervical vessels as above. 4.  Aortic Atherosclerosis (ICD10-I70.0).   Ct Cervical Spine Wo Contrast April 11, 2017 IMPRESSION: No acute abnormality of the cervical spine.  Dg Chest Port 1 View 03-30-17 IMPRESSION: Minimal left basilar opacity may reflect atelectasis or possibly mild infection. Borderline cardiomegaly. No displaced rib fracture seen.  Ct Head Code Stroke W/o Cm 30-Mar-2017 IMPRESSION: 1. No acute intracranial hemorrhage. 2. Blurring of gray-white differentiation at the right basal ganglia of/the insula and hyperdense appearance of the right middle cerebral artery, suggestive of right MCA thromboembolism and/or right lenticulocapsular acute ischemia. 3. ASPECTS is 8.  TTE - Left ventricle: The cavity size was normal. Posterior wall   thickness was  increased in a pattern of mild LVH. Systolic   function was normal. The estimated ejection fraction was in the   range of 55% to 60%. Wall motion was normal; there were no   regional wall motion abnormalities. - Aortic valve: Valve mobility was restricted. Transvalvular   velocity was within the normal range. There was no stenosis.   There was mild regurgitation. Valve area (Vmax): 1.76 cm^2. - Mitral valve: Calcified annulus. Transvalvular velocity was   within the normal range. There was no evidence for stenosis.   There was mild regurgitation. - Left atrium: The atrium was moderately dilated. - Right ventricle: The cavity size was normal. Wall thickness was   normal. Systolic function was normal. - Tricuspid valve: There was moderate regurgitation. - Pulmonic valve: There was moderate regurgitation. - Pulmonary arteries: Systolic pressure was moderately to severely   increased. PA peak pressure: 56 mm Hg (S).  MRI pending   PHYSICAL EXAM  Temp:  [97 F (36.1 C)-99.2 F (37.3 C)] 99.2 F (37.3 C) (06/22 1600) Pulse Rate:  [52-73] 65 (06/22 1600) Resp:  [11-28] 13 (06/22 1600) BP: (129-192)/(38-150) 178/67 (06/22 1600) SpO2:  [91 %-100 %] 97 % (06/22 1600) Weight:  [154 lb 12.2 oz (70.2 kg)] 154 lb 12.2 oz (70.2 kg) (06/21 1757)  General - Well nourished, well developed, obtunded, left frontal bruise.  Ophthalmologic - Fundi not visualized due to noncooperation.  Cardiovascular - irregularly irregular heart rate and rhythm, telemetry showed frequent PACs and junctional rhythm.  Neuro - obtunded, not all commands, not open eyes on voice. On forced eye opening, right-sided gaze preference, not able to cross midline, not blinking to visual threat bilaterally, doll's eyes sluggish, positive corneal and gag. Left facial droop, and tongue in midline inside mouth. On pain stimulation, left upper and lower extremity purposeful movement against gravity. However, right upper extremity  0/5 and left lower extremity slight withdrawal. Sensation, coordination and gait not tested.   ASSESSMENT/PLAN Ms. Bonnie [name] is a 83 y.o. female with history of dementia, hypertension, hyperlipidemia, hypothyroidism, NSTEMI, and a previous stroke presenting with acute onset left-sided weakness and left-sided facial droop and an unwitnessed fall. She received IV t-PA 1811 on 03/30/17 .  Stroke: large right ACA and MCA infarct, with diffuse right cerebral edema and midline shift, embolic pattern, highly suspicious for undiagnosed A. fib  Resultant  obtundation, left facial droop and left hemiplegia, right gaze  CT head: hyperdense right MCA and poor gray-white matter differentiation in right basal ganglia/insula   MRI head: pending  CTA head and neck - distal right M1 occlusion, right ACA stready flow, and right distal ICA occlusion  2D Echo  EF 55-60%  LDL 184  HgbA1c pending  SCDs for VTE prophylaxis  Diet NPO time specified  No antithrombotic prior to admission, now on No antithrombotic within 24h of tPA  Therapy recommendations:  pending  Disposition:  Pending  Need family discussion regarding Goal care due to poor  prognosis  Cerebral edema  Secondary to large right ACA, MCA stroke with 4 mm leftward shift  Poor prognosis  Need family discussion regarding goal care  Hypertension  Stable   On clevidipine gtt  Post tPA goal SBP 100-180, DBP < 105  Long-term BP goal 130-150 due to right ICA occlusion  Hyperlipidemia  Home meds: atorvastatin 40mg  PO daily  LDL 184, goal < 70  Holding statin due to no po access  Other Stroke Risk Factors  Advanced age  Hx stroke/TIA - ? Left occipital old infarct  Other Active Problems  Dementia at baseline  Hospital day # 1  This patient is critically ill due to right MCA and ACA large infarct, status post TPA, history stroke, cerebral edema, obtundation and at significant risk of neurological worsening, death  form recurrent infarct, hemorrhagic transmission, aspiration pneumonia, and seizure, and cerebral edema and brain herniation. This patient's care requires constant monitoring of vital signs, hemodynamics, respiratory and cardiac monitoring, review of multiple databases, neurological assessment, discussion with family, other specialists and medical decision making of high complexity. I spent 45 minutes of neurocritical care time in the care of this patient.  Marvel Plan, MD PhD Stroke Neurology 03/27/2017 6:08 PM   To contact Stroke Continuity provider, please refer to [email or web address]. After hours, contact General Neurology

## 2017-03-28 ENCOUNTER — Inpatient Hospital Stay (HOSPITAL_COMMUNITY): Payer: Medicare Other

## 2017-03-28 ENCOUNTER — Encounter (HOSPITAL_COMMUNITY): Payer: Self-pay

## 2017-03-28 DIAGNOSIS — G936 Cerebral edema: Secondary | ICD-10-CM

## 2017-03-28 DIAGNOSIS — F0280 Dementia in other diseases classified elsewhere without behavioral disturbance: Secondary | ICD-10-CM

## 2017-03-28 DIAGNOSIS — G935 Compression of brain: Secondary | ICD-10-CM

## 2017-03-28 DIAGNOSIS — G301 Alzheimer's disease with late onset: Secondary | ICD-10-CM

## 2017-03-28 LAB — HEMOGLOBIN A1C
HEMOGLOBIN A1C: 5.4 % (ref 4.8–5.6)
Mean Plasma Glucose: 108 mg/dL

## 2017-03-28 LAB — SODIUM
SODIUM: 141 mmol/L (ref 135–145)
Sodium: 138 mmol/L (ref 135–145)

## 2017-03-28 MED ORDER — GLYCOPYRROLATE 1 MG PO TABS
1.0000 mg | ORAL_TABLET | ORAL | Status: DC | PRN
  Filled 2017-03-28: qty 1

## 2017-03-28 MED ORDER — HALOPERIDOL LACTATE 2 MG/ML PO CONC
0.5000 mg | ORAL | Status: DC | PRN
  Filled 2017-03-28: qty 0.3

## 2017-03-28 MED ORDER — HALOPERIDOL LACTATE 5 MG/ML IJ SOLN: 0.5000 mg | INTRAMUSCULAR | Status: DC | PRN

## 2017-03-28 MED ORDER — GLYCOPYRROLATE 0.2 MG/ML IJ SOLN: 0.2000 mg | INTRAMUSCULAR | Status: DC | PRN

## 2017-03-28 MED ORDER — BIOTENE DRY MOUTH MT LIQD: 15.0000 mL | OROMUCOSAL | Status: DC | PRN

## 2017-03-28 MED ORDER — HALOPERIDOL 1 MG PO TABS
0.5000 mg | ORAL_TABLET | ORAL | Status: DC | PRN
  Filled 2017-03-28: qty 1

## 2017-03-28 MED ORDER — SODIUM CHLORIDE 0.9 % IV SOLN
1.0000 mg/h | INTRAVENOUS | Status: DC
  Administered 2017-03-28: 2 mg/h via INTRAVENOUS
  Filled 2017-03-28: qty 10

## 2017-03-28 MED ORDER — SCOPOLAMINE 1 MG/3DAYS TD PT72
1.0000 | MEDICATED_PATCH | TRANSDERMAL | Status: DC
  Administered 2017-03-28: 1.5 mg via TRANSDERMAL
  Filled 2017-03-28 (×2): qty 1

## 2017-03-28 MED ORDER — ONDANSETRON 4 MG PO TBDP: 4.0000 mg | ORAL_TABLET | Freq: Four times a day (QID) | ORAL | Status: DC | PRN

## 2017-03-28 MED ORDER — POLYVINYL ALCOHOL 1.4 % OP SOLN
1.0000 [drp] | Freq: Four times a day (QID) | OPHTHALMIC | Status: DC | PRN
  Filled 2017-03-28: qty 15

## 2017-03-28 MED ORDER — SODIUM CHLORIDE 3 % IV SOLN
INTRAVENOUS | Status: DC
  Administered 2017-03-28: 75 mL/h via INTRAVENOUS
  Filled 2017-03-28 (×3): qty 500

## 2017-03-28 MED ORDER — ASPIRIN 300 MG RE SUPP: 300.0000 mg | Freq: Every day | RECTAL | Status: DC

## 2017-03-28 MED ORDER — ONDANSETRON HCL 4 MG/2ML IJ SOLN: 4.0000 mg | Freq: Four times a day (QID) | INTRAMUSCULAR | Status: DC | PRN

## 2017-03-28 NOTE — Progress Notes (Signed)
PT Cancellation Note  Patient Details Name: Elayna Fausto MRN: [id number] DOB: 05/12/1922   Cancelled Treatment:    Reason Eval/Treat Not Completed: Medical issues which prohibited therapy. Pt remains on strict bedrest at this time. PT will continue to follow for PT evaluation when pt is medically ready.   Alessandra Bevels Hisae Decoursey 03/28/2017, 8:25 AM

## 2017-03-28 NOTE — Progress Notes (Signed)
Patient resting comfortably, son at bedside. Morphine infusing to left hand PIV at 2mg /hr.

## 2017-03-28 NOTE — Plan of Care (Signed)
I had long discussion with daughter diane and son [name] at bedside, updated pt current condition, reviewed images, discussed with grave prognosis. They are in unanimous decision (as per patient wishes) that they want comfort care given the nature of the condition is serious and terminal. As per patient and family wishes we will initiate comfort care and stop any life prolonging therapies.   Marvel Plan, MD PhD Stroke Neurology 03/28/2017 11:27 AM

## 2017-03-28 NOTE — Progress Notes (Signed)
STROKE TEAM PROGRESS NOTE   SUBJECTIVE (INTERVAL HISTORY) Her family is not at the bedside. I called daughter Elita Quick and she is in [place]. I called son [name] and daughter Sedalia Muta, they did not pick up the phone. Patient still obtunded, eyes closed, not follow commands. MRI showed large right MCA and ACA infarct. Her right pupil is bigger than left, she was put on 3% saline overnight. Need family discussion regarding further management options.    OBJECTIVE Temp:  [97.8 F (36.6 C)-99.2 F (37.3 C)] 97.8 F (36.6 C) (06/23 0400) Pulse Rate:  [59-121] 109 (06/23 0700) Cardiac Rhythm: Atrial fibrillation (06/23 0400) Resp:  [13-26] 18 (06/23 0700) BP: (131-185)/(47-88) 147/72 (06/23 0700) SpO2:  [95 %-100 %] 100 % (06/23 0700)  CBC:   Recent Labs Lab 03/22/2017 1754 04/01/2017 1801  WBC  --  6.1  NEUTROABS  --  2.6  HGB 12.6 12.4  HCT 37.0 38.4  MCV  --  88.5  PLT  --  167    Basic Metabolic Panel:   Recent Labs Lab 03/06/2017 1754 03/10/2017 1801 03/28/17 0258  NA 140 141 138  K 6.1* 4.9  --   CL 109 110  --   CO2  --  24  --   GLUCOSE 104* 98  --   BUN 46* 28*  --   CREATININE 1.20* 1.27*  --   CALCIUM  --  9.0  --     Lipid Panel:     Component Value Date/Time   CHOL 254 (H) 03/27/2017 0238   TRIG 107 03/27/2017 0238   HDL 49 03/27/2017 0238   CHOLHDL 5.2 03/27/2017 0238   VLDL 21 03/27/2017 0238   LDLCALC 184 (H) 03/27/2017 0238   HgbA1c:  Lab Results  Component Value Date   HGBA1C 5.4 03/27/2017   Urine Drug Screen: No results found for: LABOPIA, COCAINSCRNUR, LABBENZ, AMPHETMU, THCU, LABBARB  Alcohol Level     Component Value Date/Time   ETH <5 03/27/2017 1801    IMAGING I have personally reviewed the radiological images below and agree with the radiology interpretations.   Ct Angio Head and neck W Or Wo Contrast 03/27/2017 1. Occlusion/near complete occlusion of the distal intracranial right ICA. Distal right M1 occlusion and reduced right ACA flow  with multiple filling defects.  2. Acute infarction involving the entirety of the right ACA and right MCA territories.   Diffuse right cerebral hemispheric edema with 4 mm of leftward midline shift.  3. Atherosclerosis involving the intracranial and cervical vessels as above.  4.  Aortic Atherosclerosis (ICD10-I70.0).    Ct Cervical Spine Wo Contrast 03/17/2017 No acute abnormality of the cervical spine.   Dg Chest Port 1 View 03/12/2017 Minimal left basilar opacity may reflect atelectasis or possibly mild infection. Borderline cardiomegaly. No displaced rib fracture seen.   Ct Head Code Stroke W/o Cm 03/28/2017 1. No acute intracranial hemorrhage.  2. Blurring of gray-white differentiation at the right basal ganglia of/the insula and hyperdense appearance of the right middle cerebral artery, suggestive of right MCA thromboembolism and/or right lenticulocapsular acute ischemia.  3. ASPECTS is 8.   TTE - Left ventricle: The cavity size was normal. Posterior wall   thickness was increased in a pattern of mild LVH. Systolic   function was normal. The estimated ejection fraction was in the   range of 55% to 60%. Wall motion was normal; there were no   regional wall motion abnormalities. - Aortic valve: Valve mobility was restricted. Transvalvular  velocity was within the normal range. There was no stenosis.   There was mild regurgitation. Valve area (Vmax): 1.76 cm^2. - Mitral valve: Calcified annulus. Transvalvular velocity was   within the normal range. There was no evidence for stenosis.   There was mild regurgitation. - Left atrium: The atrium was moderately dilated. - Right ventricle: The cavity size was normal. Wall thickness was   normal. Systolic function was normal. - Tricuspid valve: There was moderate regurgitation. - Pulmonic valve: There was moderate regurgitation. - Pulmonary arteries: Systolic pressure was moderately to severely   increased. PA peak pressure: 56 mm  Hg (S).  MRI Brain Wo Contrast 03/27/2017 1. Large acute infarct of the entire right anterior and middle cerebral artery territories. No acute hemorrhage. 2. Leftward midline shift of 8 mm, increased from the previous study.   Ct Head W/o Cm 03/28/2017 1. Large right hemispheric infarct with increased edema and mass effect and complete effacement of the right lateral ventricle. There is increased right to left midline shift now measuring approximately 14 mm (previously 6 mm). 2. No acute intracranial hemorrhage.   PHYSICAL EXAM  Temp:  [97.8 F (36.6 C)-99.2 F (37.3 C)] 97.8 F (36.6 C) (06/23 0400) Pulse Rate:  [59-121] 109 (06/23 0700) Resp:  [13-26] 18 (06/23 0700) BP: (131-185)/(47-88) 147/72 (06/23 0700) SpO2:  [95 %-100 %] 100 % (06/23 0700)  General - Well nourished, well developed, obtunded, left frontal bruise.  Ophthalmologic - Fundi not visualized due to noncooperation.  Cardiovascular - irregularly irregular heart rate and rhythm, telemetry showed frequent PACs and junctional rhythm.  Neuro - obtunded, not follow commands, not open eyes on voice. On forced eye opening, right-sided gaze preference, not able to cross midline, not blinking to visual threat bilaterally, right pupil 4mm, left pupil 2.81mm, not reactive to light, doll's eyes sluggish, weak corneal and gag. Left facial droop, and tongue in midline inside mouth. On pain stimulation, left upper and lower extremity purposeful movement but not able to against gravity. However, right upper extremity 0/5 and left lower extremity slight withdrawal. Sensation, coordination and gait not able to test.   ASSESSMENT/PLAN Ms. Alaiah Lundy is a 83 y.o. female with history of dementia, hypertension, hyperlipidemia, hypothyroidism, NSTEMI, and a previous stroke presenting with acute onset left-sided weakness and left-sided facial droop and an unwitnessed fall. She received IV t-PA 1811 on 2017-04-12 .  Stroke: large right  ACA and MCA infarct, with diffuse right cerebral edema and midline shift, embolic pattern, highly suspicious for undiagnosed A. fib  Resultant  obtundation, left facial droop and left hemiplegia, right gaze  CT head: hyperdense right MCA and poor gray-white matter differentiation in right basal ganglia/insula   MRI head:  Leftward midline shift of 8 mm. Large acute infarct -  Rt  ACA and MCA territories.   CTA head and neck - distal right M1 occlusion, right ACA stready flow, and right distal ICA occlusion  CT repeat - significantly increased midline shift with stable mild hydracephalus  2D Echo  EF 55-60%  LDL 184  HgbA1c - 5.4  SCDs for VTE prophylaxis Diet NPO time specified  No antithrombotic prior to admission, on No antithrombotic within 24h of tPA. On ASA suppository now.   Disposition:  Pending  Need family discussion regarding Goal care due to poor prognosis  Cerebral edema / uncal herniation  Secondary to large right ACA, MCA stroke with 4 mm leftward shift  Poor prognosis  CT repeat - significantly increased midline shift with  stable mild hydracephalus  Put on 3% saline over night for anisocoria.   Need family discussion regarding goal of care.  Hypertension  Stable   On clevidipine gtt  Current BP goal < 180 due to acute stroke  Long term BP goal 130-150 due to right ICA occlusion  Hyperlipidemia  Home meds: atorvastatin 40mg  PO daily  LDL 184, goal < 70  Holding statin due to no po access  Other Stroke Risk Factors  Advanced age  Hx stroke/TIA - ? Left occipital old infarct  Other Active Problems  Dementia at baseline  Renal - 28 / 1.27  Hospital day # 2  This patient is critically ill due to right MCA and ACA large infarct, status post TPA, history stroke, cerebral edema, obtundation and at significant risk of neurological worsening, death form recurrent infarct, hemorrhagic transmission, aspiration pneumonia, and seizure, and cerebral  edema and brain herniation. This patient's care requires constant monitoring of vital signs, hemodynamics, respiratory and cardiac monitoring, review of multiple databases, neurological assessment, discussion with family, other specialists and medical decision making of high complexity. I spent 40 minutes of neurocritical care time in the care of this patient.  I called daughter Elita Quick and she is in [place] and she would like me call her sister and brother in [place]. I called son [name] and daughter Sedalia Muta, they did not pick up the phone. Need family discussion regarding further management options.   Marvel Plan, MD PhD Stroke Neurology 03/28/2017 10:46 AM  To contact Stroke Continuity provider, please refer to [email or web address]. After hours, contact General Neurology

## 2017-03-28 NOTE — Progress Notes (Addendum)
S/O: Now with dilated and unreactive right pupil. Still moving right side semipurposefully. No left sided movement. More difficult to arouse.   BP (!) 148/59   Pulse 94   Temp 98.4 F (36.9 C) (Axillary)   Resp (!) 24   Wt 70.2 kg (154 lb 12.2 oz)   SpO2 98%   BMI 27.42 kg/m   A/R: 1. Changing neurological examination, most likely secondary to increased mass effect from massive right hemisphere strokes. 2. Starting hypertonic saline protocol with 3% saline infusion at 75 cc/hr. 3. STAT head CT. 4. Not a neurosurgical candidate as craniectomy would not change prognosis.  5. The patient is DNR/DNI. 6. Prognosis is dismal. Attempted to contact both of her daughters by telephone with no answer. Team to re-attempt contacting family at AM rounds to discuss goals of care.    Electronically signed: Dr. Caryl Pina

## 2017-03-28 NOTE — Progress Notes (Signed)
SLP Cancellation Note  Patient Details Name: Bonnie [name] MRN: [id number] DOB: 07-24-1922   Cancelled treatment:       Reason Eval/Treat Not Completed: Patient not medically ready. Will sign off at this time. Please reorder if needed.    Esaul Dorwart, Riley Nearing 03/28/2017, 7:32 AM

## 2017-03-28 NOTE — Progress Notes (Signed)
Obtained order for foley catheter for end of life care for pt comfort.  Oral care done.

## 2017-03-28 NOTE — Progress Notes (Signed)
Spoke with son, [name] to make sure he knew of new room #.

## 2017-03-28 NOTE — Progress Notes (Signed)
Pt received into [facility] Palliative care, morphine drip infusing at 2.

## 2017-03-29 DIAGNOSIS — G935 Compression of brain: Secondary | ICD-10-CM

## 2017-03-31 LAB — CULTURE, BLOOD (ROUTINE X 2)
CULTURE: NO GROWTH
CULTURE: NO GROWTH
Special Requests: ADEQUATE
Special Requests: ADEQUATE

## 2017-04-05 NOTE — Progress Notes (Signed)
Pt passed away, pronounced at 0940 by Kirtland Bouchard, RN and Elita Quick, RN.  Dr. Roda Shutters notified and son, [name]. Wasted 170 cc morphine drip in sink and witnessed by [place].

## 2017-04-05 NOTE — Death Summary Note (Signed)
Stroke Discharge Summary  Patient ID: Bonnie [name]   MRN: [id number]      DOB: Jun 24, 1922  Date of Admission: 04/01/2017 Date of Discharge: 21-Apr-2017  Attending Physician:  Marvel Plan, MD, Stroke MD Consultant(s):   none Patient's PCP:  Patient, No Pcp Per  DISCHARGE DIAGNOSIS: large right ACA and MCA infarct, with diffuse right cerebral edema Active Problems:   CVA (cerebral vascular accident) (HCC)   Uncal herniation   Cerebral edema   Obstructive hydrocephalus   HTN   HLD   Dementia    History of stroke   Past Medical History:  Diagnosis Date  . Dementia   . Hypertension   . Hypothyroid    History reviewed. No pertinent surgical history.  Allergies as of 2017/04/21      Reactions   Tramadol Itching   This allergy was already present in Epic, but not on her MAR (I left it)      LABORATORY STUDIES CBC    Component Value Date/Time   WBC 6.1 03/25/2017 1801   RBC 4.34 03/14/2017 1801   HGB 12.4 03/18/2017 1801   HCT 38.4 03/17/2017 1801   PLT 167 03/11/2017 1801   MCV 88.5 03/24/2017 1801   MCH 28.6 03/25/2017 1801   MCHC 32.3 03/11/2017 1801   RDW 14.1 04/02/2017 1801   LYMPHSABS 3.0 03/13/2017 1801   MONOABS 0.3 03/13/2017 1801   EOSABS 0.2 03/20/2017 1801   BASOSABS 0.0 03/13/2017 1801   CMP    Component Value Date/Time   NA 141 03/28/2017 1008   K 4.9 03/19/2017 1801   CL 110 03/22/2017 1801   CO2 24 03/19/2017 1801   GLUCOSE 98 03/21/2017 1801   BUN 28 (H) 03/19/2017 1801   CREATININE 1.27 (H) 04/04/2017 1801   CALCIUM 9.0 03/08/2017 1801   PROT 6.3 (L) 03/12/2017 1801   ALBUMIN 3.9 03/13/2017 1801   AST 18 03/24/2017 1801   ALT 10 (L) 03/25/2017 1801   ALKPHOS 82 03/10/2017 1801   BILITOT 0.5 03/12/2017 1801   GFRNONAA 36 (L) 03/12/2017 1801   GFRAA 41 (L) 04/03/2017 1801   COAGS Lab Results  Component Value Date   INR 0.99 03/08/2017   INR 1.03 10/05/2016   INR 0.91 10/04/2016   Lipid Panel    Component Value Date/Time    CHOL 254 (H) 03/27/2017 0238   TRIG 107 03/27/2017 0238   HDL 49 03/27/2017 0238   CHOLHDL 5.2 03/27/2017 0238   VLDL 21 03/27/2017 0238   LDLCALC 184 (H) 03/27/2017 0238   HgbA1C  Lab Results  Component Value Date   HGBA1C 5.4 03/27/2017   Urinalysis No results found for: COLORURINE, APPEARANCEUR, LABSPEC, PHURINE, GLUCOSEU, HGBUR, BILIRUBINUR, KETONESUR, PROTEINUR, UROBILINOGEN, NITRITE, LEUKOCYTESUR Urine Drug Screen No results found for: LABOPIA, COCAINSCRNUR, LABBENZ, AMPHETMU, THCU, LABBARB  Alcohol Level    Component Value Date/Time   ETH <5 03/15/2017 1801     SIGNIFICANT DIAGNOSTIC STUDIES I have personally reviewed the radiological images below and agree with the radiology interpretations.   Ct Angio Head and neck W Or Wo Contrast 03/27/2017 1. Occlusion/near complete occlusion of the distal intracranial right ICA. Distal right M1 occlusion and reduced right ACA flow with multiple filling defects.  2. Acute infarction involving the entirety of the right ACA and right MCA territories.              Diffuse right cerebral hemispheric edema with 4 mm of leftward midline shift.  3. Atherosclerosis involving  the intracranial and cervical vessels as above.  4.  Aortic Atherosclerosis (ICD10-I70.0).    Ct Cervical Spine Wo Contrast 04-10-17 No acute abnormality of the cervical spine.   Dg Chest Port 1 View Apr 10, 2017 Minimal left basilar opacity may reflect atelectasis or possibly mild infection. Borderline cardiomegaly. No displaced rib fracture seen.   Ct Head Code Stroke W/o Cm Apr 10, 2017 1. No acute intracranial hemorrhage.  2. Blurring of gray-white differentiation at the right basal ganglia of/the insula and hyperdense appearance of the right middle cerebral artery, suggestive of right MCA thromboembolism and/or right lenticulocapsular acute ischemia.  3. ASPECTS is 8.  TTE - Left ventricle: The cavity size was normal. Posterior wall thickness  was increased in a pattern of mild LVH. Systolic function was normal. The estimated ejection fraction was in the range of 55% to 60%. Wall motion was normal; there were no regional wall motion abnormalities. - Aortic valve: Valve mobility was restricted. Transvalvular velocity was within the normal range. There was no stenosis. There was mild regurgitation. Valve area (Vmax): 1.76 cm^2. - Mitral valve: Calcified annulus. Transvalvular velocity was within the normal range. There was no evidence for stenosis. There was mild regurgitation. - Left atrium: The atrium was moderately dilated. - Right ventricle: The cavity size was normal. Wall thickness was normal. Systolic function was normal. - Tricuspid valve: There was moderate regurgitation. - Pulmonic valve: There was moderate regurgitation. - Pulmonary arteries: Systolic pressure was moderately to severely increased. PA peak pressure: 56 mm Hg (S).  MRI Brain Wo Contrast 03/27/2017 1. Large acute infarct of the entire right anterior and middle cerebral artery territories. No acute hemorrhage. 2. Leftward midline shift of 8 mm, increased from the previous study.   Ct Head W/o Cm 03/28/2017 1. Large right hemispheric infarct with increased edema and mass effect and complete effacement of the right lateral ventricle. There is increased right to left midline shift now measuring approximately 14 mm (previously 6 mm). 2. No acute intracranial hemorrhage.    HISTORY OF PRESENT ILLNESS Bonnie [name] is a 83 y.o. female with a history of previous stroke, NSTEMI, who presents with acute onset left-sided weakness. She was sitting in the common room, but apparently was not acting completely her normal self around 445. She then stood up to go to her room and while she was there she fell. She was then brought in emergently as a code stroke. After EMS noted left-sided weakness.  After discussion with staff, they were uncertain  when she was last seen well but they finally found a staff member who had a conversation with her at 2 PM and she was completely her normal self at that time.   LKW: 2pm tpa given?: yes Modified Rankin Score: 3   HOSPITAL COURSE Ms. Bonnie [name] is a 83 y.o. female with history of dementia, hypertension, hyperlipidemia, hypothyroidism, NSTEMI, and a previous stroke presenting with acute onset left-sided weakness and left-sided facial droop and an unwitnessed fall. She received IV t-PA 1811 on 2017-04-10 .  Stroke: large right ACA and MCA infarct, with diffuse right cerebral edema and midline shift, embolic pattern, highly suspicious for undiagnosed A. fib  Resultant  obtundation, left facial droop and left hemiplegia, right gaze  CT head: hyperdense right MCA and poor gray-white matter differentiation in right basal ganglia/insula   MRI head:  Leftward midline shift of 8 mm. Large acute infarct -  Rt  ACA and MCA territories.   CTA head and neck - distal right M1 occlusion,  right ACA stready flow, and right distal ICA occlusion  CT repeat - significantly increased midline shift with stable mild hydracephalus  2D Echo  EF 55-60%  LDL 184  HgbA1c - 5.4  SCDs for VTE prophylaxis  Diet NPO time specified  Disposition: comfort care measures  Cerebral edema / uncal herniation  Secondary to large right ACA, MCA stroke with 4 mm leftward shift  Poor prognosis  CT repeat - significantly increased midline shift with stable mild hydracephalus  Put on 3% saline over night for anisocoria, later stopped due to comfort care measures.   Hypertension  Stable   Comfort care   Hyperlipidemia  Home meds: atorvastatin 40mg  PO daily  LDL 184, goal < 70  Other Stroke Risk Factors  Advanced age  Hx stroke/TIA - ? Left occipital old infarct  Other Active Problems  Dementia at baseline  Renal - 28 / 1.27  DISCHARGE EXAM deceased  Discharge Diet   Diet NPO time  specified liquids  30 minutes were spent preparing discharge.  Marvel Plan, MD PhD Stroke Neurology 03/20/2017 10:04 AM

## 2017-04-05 NOTE — Progress Notes (Signed)
[facility] referral completed.

## 2017-04-05 DEATH — deceased

## 2018-09-03 IMAGING — DX DG CHEST 1V PORT
1 series · 1 of 1 positions shown · non-contrast
Comparison: Chest radiograph performed 03/03/2017

CLINICAL DATA: Status post fall, with shortness of breath. Elevated
troponin. Initial encounter.

EXAM:
PORTABLE CHEST 1 VIEW

[chest ap]
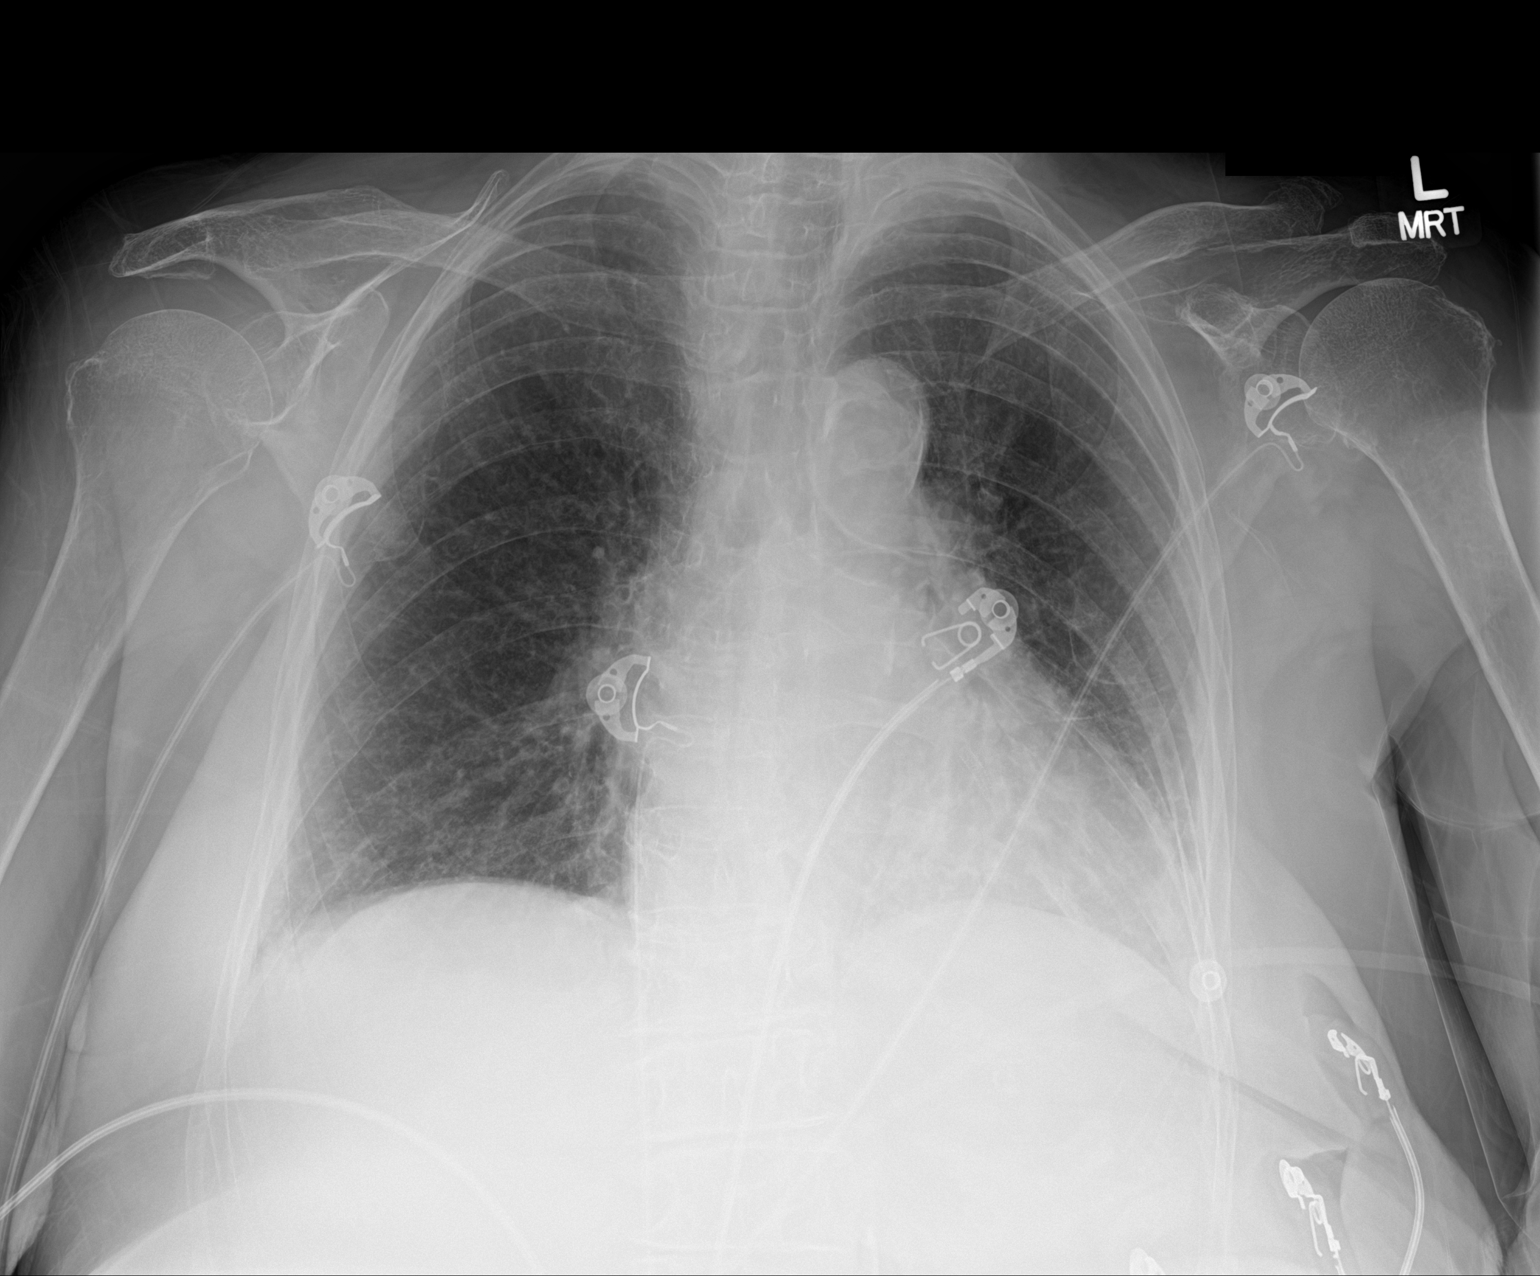

[1 of 1 positions shown; findings below may reference images not displayed]

FINDINGS: The lungs are well-aerated. Minimal left basilar opacity may reflect
atelectasis or possibly mild infection. There is no evidence of
pleural effusion or pneumothorax.

The cardiomediastinal silhouette is borderline enlarged. No acute
osseous abnormalities are seen.
IMPRESSION: Minimal left basilar opacity may reflect atelectasis or possibly
mild infection. Borderline cardiomegaly. No displaced rib fracture
seen.

## 2018-09-05 IMAGING — CT CT HEAD W/O CM
3 series · 14 of 47 positions shown, 16 images · non-contrast
Comparison: Brain MRI dated 03/27/2017

CLINICAL DATA: [AGE] female with CVA.

EXAM:
CT HEAD WITHOUT CONTRAST
TECHNIQUE: Contiguous axial images were obtained from the base of the skull
through the vertex without intravenous contrast.

[Series 3: head 5.0 h30s · axial · 0.48mm/px · z∈[-163,-28]mm · 8 of 33 slices shown, 10 images]
[im 3/33  brain]
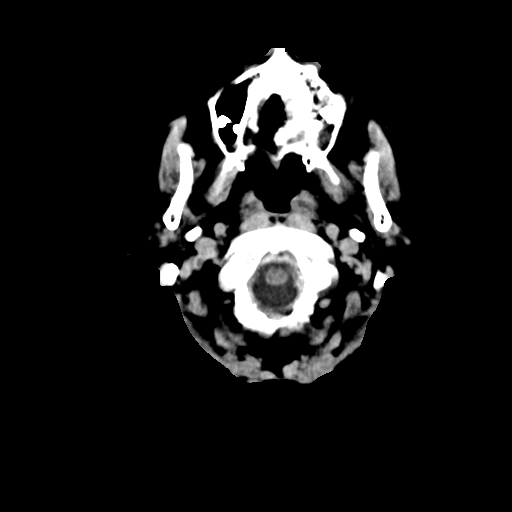
[im 3/33  bone]
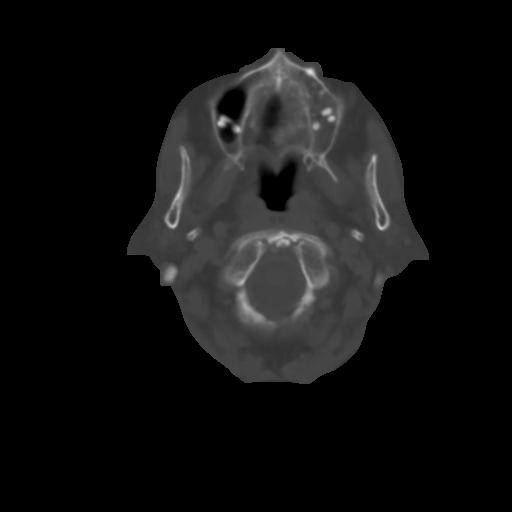
[im 7/33  brain]
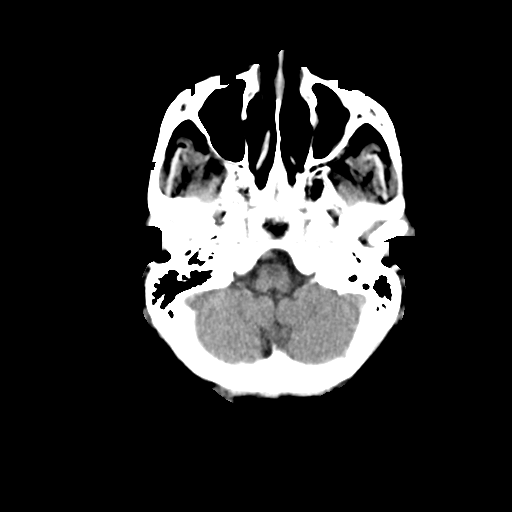
[im 10/33  brain]
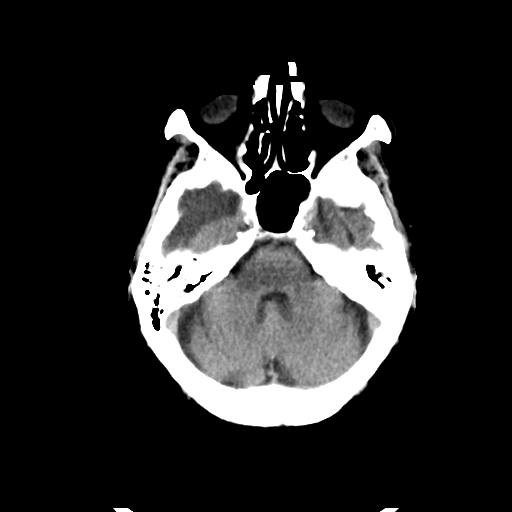
[im 15/33  brain]
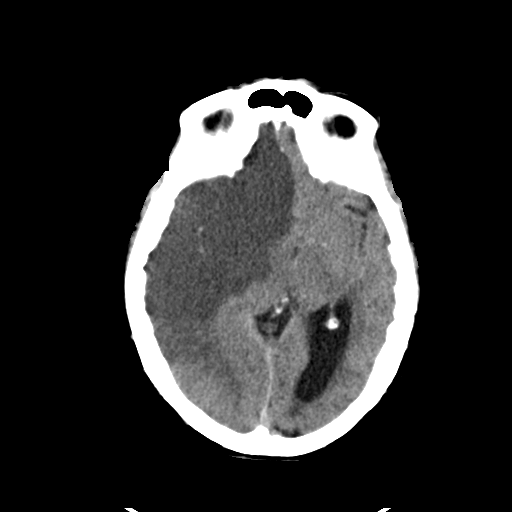
[im 18/33  brain]
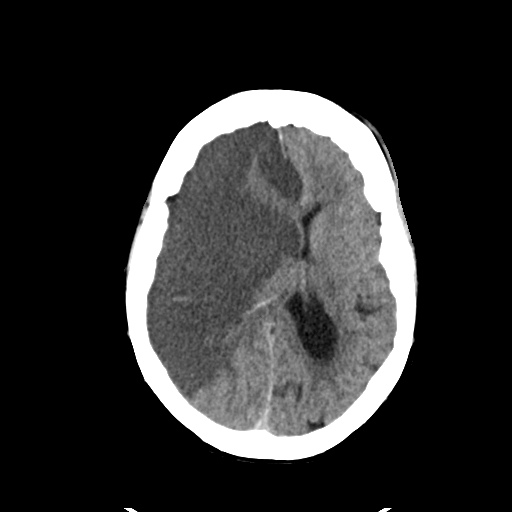
[im 18/33  bone]
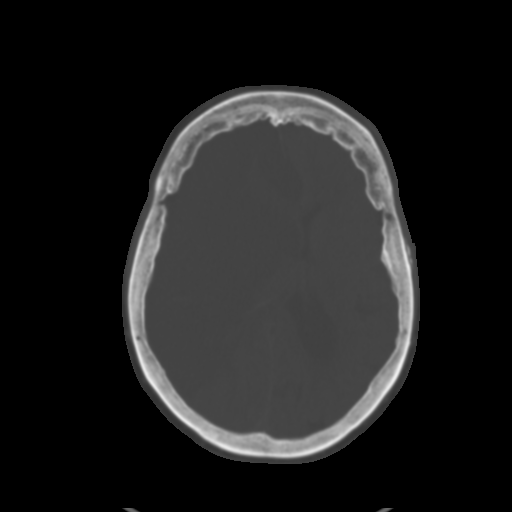
[im 23/33  brain]
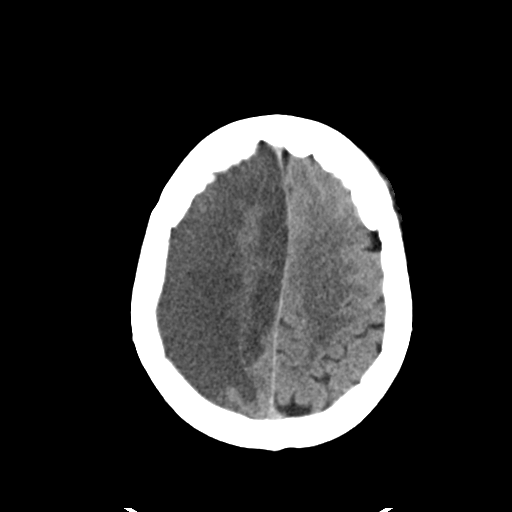
[im 26/33  brain]
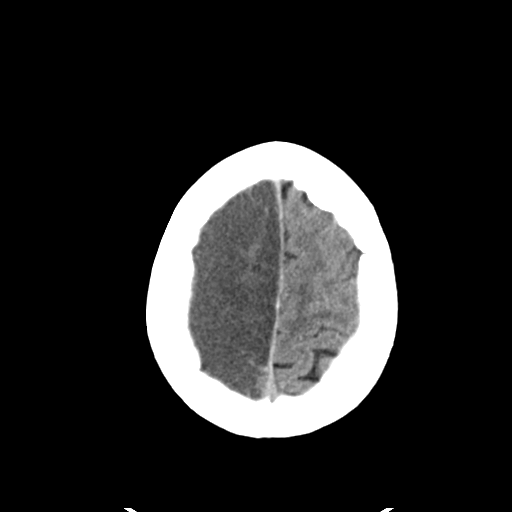
[im 30/33  brain]
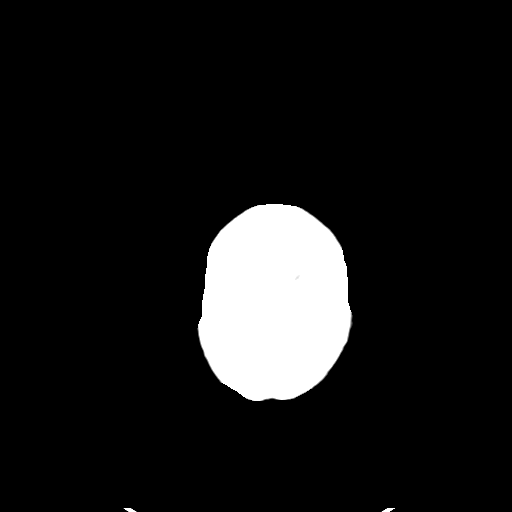

[Series 5: head 3.0 mpr cor · coronal · 0.32mm/px · 3 of 70 slices shown]
[im 24/70  brain]
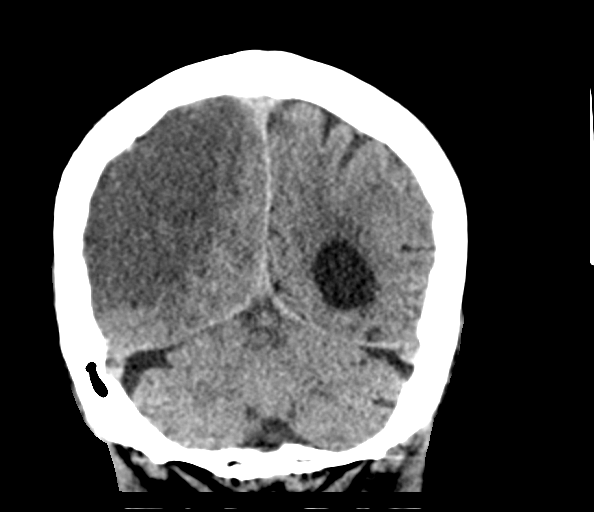
[im 31/70  brain]
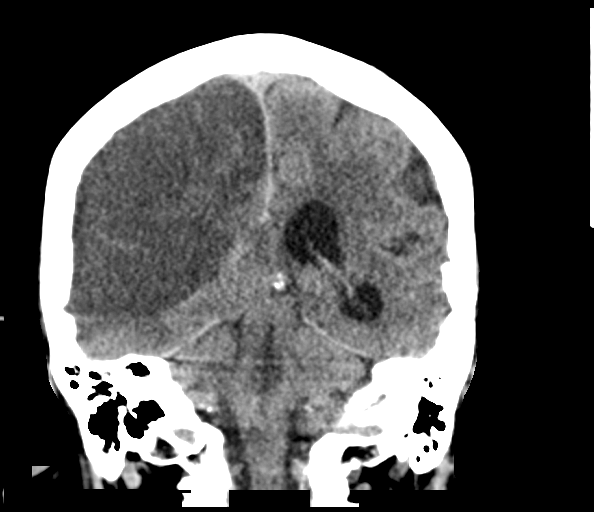
[im 39/70  brain]
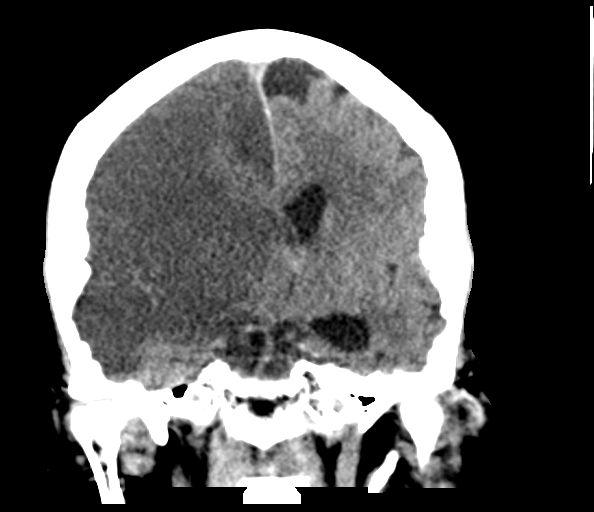

[Series 6: head 3.0 mpr sag · sagittal · 0.32mm/px · 3 of 55 slices shown]
[im 19/55  brain]
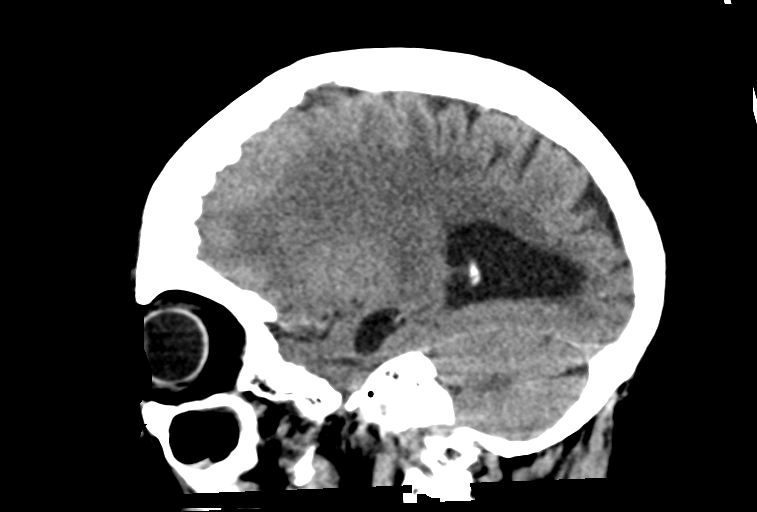
[im 28/55  brain]
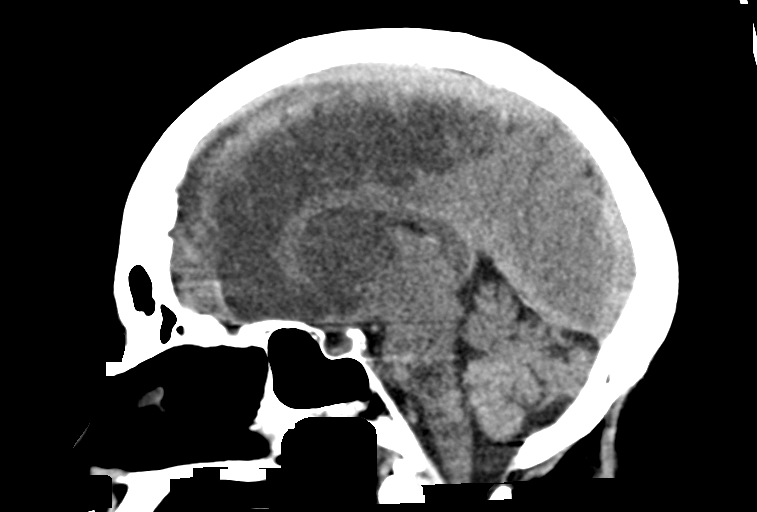
[im 37/55  brain]
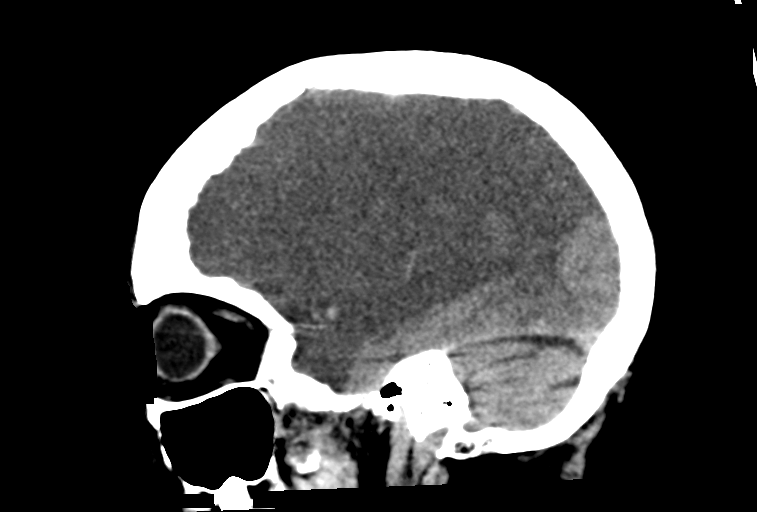

[14 of 47 positions shown; findings below may reference images not displayed]

FINDINGS: Brain: Large right hemispheric infarct in the distribution of the
right anterior and middle cerebral artery territory cyst as seen on
the prior MRI. There is edema with associated mass effect and
complete effacement of the right lateral ventricle which has
progressed compared to the prior MRI. There is approximately 14 mm
right to left midline shift, measured at the level of foramen Radu
on series 3, image 17 (measuring approximately 6 mm on the MRI of
03/27/2017 by similar measurement technique). There is dilatation of
the left lateral ventricle relatively stable compared to the prior
MRI. There is no acute intracranial hemorrhage.

Vascular: No unexpected calcification.

Skull: Normal. Negative for fracture or focal lesion.

Sinuses/Orbits: Mild mucoperiosteal thickening of paranasal sinuses.
No air-fluid levels.

Other: None
IMPRESSION: 1. Large right hemispheric infarct with increased edema and mass
effect and complete effacement of the right lateral ventricle. There
is increased right to left midline shift now measuring approximately
14 mm (previously 6 mm).
2. No acute intracranial hemorrhage.
These results were called by telephone at the time of interpretation
on 03/28/2017 at [DATE] to Dr. KRISTIAN FREDRIK ALBRIKTSEN , who verbally
acknowledged these results.
